# Patient Record
Sex: Female | Born: 1990 | Race: Black or African American | Hispanic: No | Marital: Married | Smoking: Former smoker
Health system: Southern US, Community
[De-identification: ages and names within clinical notes are randomized; demographics above are authoritative.]

## PROBLEM LIST (undated history)

## (undated) DIAGNOSIS — E119 Type 2 diabetes mellitus without complications: Secondary | ICD-10-CM

## (undated) DIAGNOSIS — R87629 Unspecified abnormal cytological findings in specimens from vagina: Secondary | ICD-10-CM

## (undated) DIAGNOSIS — J45909 Unspecified asthma, uncomplicated: Secondary | ICD-10-CM

## (undated) DIAGNOSIS — I10 Essential (primary) hypertension: Secondary | ICD-10-CM

## (undated) DIAGNOSIS — Z8719 Personal history of other diseases of the digestive system: Secondary | ICD-10-CM

## (undated) HISTORY — DX: Unspecified asthma, uncomplicated: J45.909

## (undated) HISTORY — DX: Unspecified abnormal cytological findings in specimens from vagina: R87.629

## (undated) HISTORY — DX: Essential (primary) hypertension: I10

## (undated) HISTORY — DX: Personal history of other diseases of the digestive system: Z87.19

## (undated) HISTORY — PX: CERVICAL POLYPECTOMY: SHX88

---

## 2010-08-11 ENCOUNTER — Emergency Department (HOSPITAL_COMMUNITY)
Admission: EM | Admit: 2010-08-11 | Discharge: 2010-08-11 | Disposition: A | Discharge status: Home / Self Care | Payer: BC Managed Care – PPO | Attending: Emergency Medicine | Admitting: Emergency Medicine

## 2010-08-11 ENCOUNTER — Emergency Department (HOSPITAL_COMMUNITY): Payer: BC Managed Care – PPO

## 2010-08-11 DIAGNOSIS — X500XXA Overexertion from strenuous movement or load, initial encounter: Secondary | ICD-10-CM | POA: Insufficient documentation

## 2010-08-11 DIAGNOSIS — S8000XA Contusion of unspecified knee, initial encounter: Secondary | ICD-10-CM | POA: Insufficient documentation

## 2010-08-11 DIAGNOSIS — M25569 Pain in unspecified knee: Secondary | ICD-10-CM | POA: Insufficient documentation

## 2010-08-11 DIAGNOSIS — E119 Type 2 diabetes mellitus without complications: Secondary | ICD-10-CM | POA: Insufficient documentation

## 2013-01-24 ENCOUNTER — Emergency Department (HOSPITAL_COMMUNITY)
Admission: EM | Admit: 2013-01-24 | Discharge: 2013-01-24 | Disposition: A | Discharge status: Home / Self Care | Payer: BC Managed Care – PPO | Attending: Emergency Medicine | Admitting: Emergency Medicine

## 2013-01-24 ENCOUNTER — Encounter (HOSPITAL_COMMUNITY): Payer: Self-pay | Admitting: Adult Health

## 2013-01-24 DIAGNOSIS — Y990 Civilian activity done for income or pay: Secondary | ICD-10-CM | POA: Insufficient documentation

## 2013-01-24 DIAGNOSIS — T63461A Toxic effect of venom of wasps, accidental (unintentional), initial encounter: Secondary | ICD-10-CM | POA: Insufficient documentation

## 2013-01-24 DIAGNOSIS — M549 Dorsalgia, unspecified: Secondary | ICD-10-CM | POA: Insufficient documentation

## 2013-01-24 DIAGNOSIS — T6391XA Toxic effect of contact with unspecified venomous animal, accidental (unintentional), initial encounter: Secondary | ICD-10-CM | POA: Insufficient documentation

## 2013-01-24 DIAGNOSIS — Y939 Activity, unspecified: Secondary | ICD-10-CM | POA: Insufficient documentation

## 2013-01-24 DIAGNOSIS — Y9229 Other specified public building as the place of occurrence of the external cause: Secondary | ICD-10-CM | POA: Insufficient documentation

## 2013-01-24 DIAGNOSIS — S21209A Unspecified open wound of unspecified back wall of thorax without penetration into thoracic cavity, initial encounter: Secondary | ICD-10-CM | POA: Insufficient documentation

## 2013-01-24 NOTE — ED Notes (Signed)
Pt was stung by a bee at 3 pm this afternoon, denies allergy, has never been stung before and was told to come to ER. Stung on left flank.

## 2013-01-24 NOTE — ED Provider Notes (Signed)
CSN: [id number]     Arrival date & time 01/24/13  1748 History  This chart was scribed for non-physician practitioner Junius Finner, PA-C working with Junius Argyle, MD by Leone Payor, ED Scribe. This patient was seen in room TR07C/TR07C and the patient's care was started at 1748.    Chief Complaint  Patient presents with  . Insect Bite    The history is provided by the patient. No language interpreter was used.    HPI Comments: Latasha [name] is a 22 y.o. female who presents to the Emergency Department complaining of a bee sting to the back that occurred 2 hours ago. Pt denies any allergies to bees and has never been stung before. 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 She denies trouble breathing, rash, itchy throat, trouble swallowing.   History reviewed. No pertinent past medical history. No past surgical history on file. No family history on file. History  Substance Use Topics  . Smoking status: Not on file  . Smokeless tobacco: Not on file  . Alcohol Use: Not on file   OB History   Grav Para Term Preterm Abortions TAB SAB Ect Mult Living                 Review of Systems  HENT: Negative for sore throat and trouble swallowing.   Respiratory: Negative for shortness of breath.   Skin: Negative for rash.       Bee sting to the back  All other systems reviewed and are negative.    Allergies  Review of patient's allergies indicates no known allergies.  Home Medications  No current outpatient prescriptions on file. BP 124/79  Pulse 91  Temp(Src) 98.4 F (36.9 C) (Oral)  Resp 16  SpO2 98% Physical Exam  Nursing note and vitals reviewed. Constitutional: She is oriented to person, place, and time. She appears well-developed and well-nourished.  Pt lying comfortably in exam bed, NAD.    HENT:  Head: Normocephalic and atraumatic.  Eyes: EOM are normal.  Neck: Normal range of motion.  Cardiovascular:  Normal rate, regular rhythm and normal heart sounds.   Pulmonary/Chest: Effort normal and breath sounds normal. No respiratory distress. She has no wheezes. She has no rales. She exhibits no tenderness.  No respiratory distress, able to speak in full sentences w/o difficulty.  Abdominal: Soft. Bowel sounds are normal. She exhibits no distension and no mass. There is no tenderness. There is no rebound and no guarding.  Musculoskeletal: Normal range of motion.  Neurological: She is alert and oriented to person, place, and time.  Skin: Skin is warm and dry.  Pin-point puncture mark on left middle back, mild erythema and TTP.  No induration or drainage. No foreign bodies seen at puncture site.  Psychiatric: She has a normal mood and affect. Her behavior is normal.    ED Course  Procedures   DIAGNOSTIC STUDIES: Oxygen Saturation is 100% on RA, normal by my interpretation.    COORDINATION OF CARE: 8:00 AM Discussed treatment plan with pt at bedside and pt agreed to plan.   Labs Review Labs Reviewed - No data to display Imaging Review No results found.  MDM   1. Bee sting, initial encounter    Pt presented to ED after bee sting per request of her employer.  Pt denies previous hx of bee stings or allergies. No c/o at this time. Pt appears well.  Small puncture  wound seen in left middle back with mild TTP.  No induration or drainage. No foreign bodies seen. Pt discharged home with return precautions. May use benadryl and ice as needed for pain and irritation. Pt verbalized understanding and agreement with tx plan.  I personally performed the services described in this documentation, which was scribed in my presence. The recorded information has been reviewed and is accurate.   Junius Finner, PA-C 01/25/13 [phone]

## 2013-01-25 NOTE — ED Provider Notes (Signed)
Medical screening examination/treatment/procedure(s) were performed by non-physician practitioner and as supervising physician I was immediately available for consultation/collaboration.   Junius Argyle, MD 01/25/13 1228

## 2013-02-07 ENCOUNTER — Encounter (HOSPITAL_COMMUNITY): Payer: Self-pay | Admitting: Emergency Medicine

## 2013-02-07 ENCOUNTER — Emergency Department (HOSPITAL_COMMUNITY): Payer: BC Managed Care – PPO

## 2013-02-07 ENCOUNTER — Emergency Department (HOSPITAL_COMMUNITY)
Admission: EM | Admit: 2013-02-07 | Discharge: 2013-02-07 | Disposition: A | Discharge status: Home / Self Care | Payer: BC Managed Care – PPO | Attending: Emergency Medicine | Admitting: Emergency Medicine

## 2013-02-07 DIAGNOSIS — M545 Low back pain, unspecified: Secondary | ICD-10-CM | POA: Insufficient documentation

## 2013-02-07 DIAGNOSIS — R109 Unspecified abdominal pain: Secondary | ICD-10-CM | POA: Insufficient documentation

## 2013-02-07 DIAGNOSIS — R51 Headache: Secondary | ICD-10-CM | POA: Insufficient documentation

## 2013-02-07 DIAGNOSIS — R350 Frequency of micturition: Secondary | ICD-10-CM | POA: Insufficient documentation

## 2013-02-07 DIAGNOSIS — Z3490 Encounter for supervision of normal pregnancy, unspecified, unspecified trimester: Secondary | ICD-10-CM

## 2013-02-07 DIAGNOSIS — N898 Other specified noninflammatory disorders of vagina: Secondary | ICD-10-CM | POA: Insufficient documentation

## 2013-02-07 DIAGNOSIS — O9989 Other specified diseases and conditions complicating pregnancy, childbirth and the puerperium: Secondary | ICD-10-CM | POA: Insufficient documentation

## 2013-02-07 DIAGNOSIS — O99810 Abnormal glucose complicating pregnancy: Secondary | ICD-10-CM | POA: Insufficient documentation

## 2013-02-07 DIAGNOSIS — O99330 Smoking (tobacco) complicating pregnancy, unspecified trimester: Secondary | ICD-10-CM | POA: Insufficient documentation

## 2013-02-07 DIAGNOSIS — R300 Dysuria: Secondary | ICD-10-CM | POA: Insufficient documentation

## 2013-02-07 HISTORY — DX: Type 2 diabetes mellitus without complications: E11.9

## 2013-02-07 LAB — HCG, QUANTITATIVE, PREGNANCY: hCG, Beta Chain, Quant, S: 565 m[IU]/mL — ABNORMAL HIGH (ref <5)

## 2013-02-07 LAB — URINALYSIS, ROUTINE W REFLEX MICROSCOPIC
Hgb urine dipstick: NEGATIVE
Ketones, ur: NEGATIVE mg/dL
Leukocytes, UA: NEGATIVE
Nitrite: NEGATIVE
Specific Gravity, Urine: 1.031 — ABNORMAL HIGH (ref 1.005–1.030)
Urobilinogen, UA: 0.2 mg/dL (ref 0.0–1.0)
pH: 6 (ref 5.0–8.0)
pH: 6 (ref 5.0–8.0)

## 2013-02-07 LAB — URINE MICROSCOPIC-ADD ON

## 2013-02-07 LAB — WET PREP, GENITAL: Clue Cells Wet Prep HPF POC: NONE SEEN

## 2013-02-07 MED ORDER — ACETAMINOPHEN 500 MG PO TABS
1000.0000 mg | ORAL_TABLET | Freq: Once | ORAL | Status: AC
  Administered 2013-02-07: 1000 mg via ORAL
  Filled 2013-02-07: qty 2

## 2013-02-07 NOTE — ED Notes (Signed)
Pt is here with headache that started on Friday.  Pt with right lower back pain and frequency with urination.  No fever

## 2013-02-07 NOTE — ED Provider Notes (Signed)
CSN: [id number]     Arrival date & time 02/07/13  1449 History   First MD Initiated Contact with Patient 02/07/13 1646     Chief Complaint  Patient presents with  . Headache   (Consider location/radiation/quality/duration/timing/severity/associated sxs/prior Treatment) Patient is a 22 y.o. female presenting with headaches and dysuria. The history is provided by the patient.  Headache Pain location:  Generalized Quality:  Dull Onset quality:  Gradual Timing:  Intermittent Progression:  Waxing and waning Chronicity:  New Similar to prior headaches: yes   Relieved by:  Acetaminophen and NSAIDs Worsened by:  Activity Associated symptoms: back pain   Associated symptoms: no abdominal pain, no cough, no fatigue, no fever, no myalgias, no nausea, no neck stiffness, no numbness, no photophobia, no sore throat, no swollen glands, no URI and no vomiting   Dysuria Quality: frequency] Pain severity:  No pain Onset quality:  Gradual Duration:  2 weeks Timing:  Constant Progression:  Worsening Chronicity:  New Recent urinary tract infections: no   Associated symptoms: flank pain   Associated symptoms: no abdominal pain, no fever, no nausea, no vaginal discharge and no vomiting     Past Medical History  Diagnosis Date  . Diabetes mellitus without complication    History reviewed. No pertinent past surgical history. History reviewed. No pertinent family history. History  Substance Use Topics  . Smoking status: Current Every Day Smoker  . Smokeless tobacco: Not on file  . Alcohol Use: Yes     Comment: occ   OB History   Grav Para Term Preterm Abortions TAB SAB Ect Mult Living                 Review of Systems  Constitutional: Negative for fever and fatigue.  HENT: Negative for sore throat.   Eyes: Negative for photophobia.  Respiratory: Negative for cough.   Gastrointestinal: Negative for nausea, vomiting and abdominal pain.  Genitourinary: Positive for dysuria, frequency  and flank pain. Negative for urgency, decreased urine volume, vaginal bleeding, vaginal discharge, difficulty urinating, menstrual problem and pelvic pain.  Musculoskeletal: Positive for back pain. Negative for myalgias and neck stiffness.  Neurological: Positive for headaches. Negative for numbness.  All other systems reviewed and are negative.    Allergies  Review of patient's allergies indicates no known allergies.  Home Medications   Current Outpatient Rx  Name  Route  Sig  Dispense  Refill  . acetaminophen (TYLENOL) 500 MG tablet   Oral   Take 1,000 mg by mouth every 6 (six) hours as needed for pain.          BP 122/66  Pulse 89  Temp(Src) 97.2 F (36.2 C) (Oral)  Resp 16  Wt 267 lb 6 oz (121.281 kg)  SpO2 100%  LMP 12/25/2012 Physical Exam  Nursing note and vitals reviewed. Constitutional: She is oriented to person, place, and time. She appears well-developed and well-nourished. No distress.  HENT:  Head: Normocephalic and atraumatic.  Mouth/Throat: Oropharynx is clear and moist. No oropharyngeal exudate.  Eyes: Conjunctivae and EOM are normal. Pupils are equal, round, and reactive to light.  Fundoscopic exam:      The right eye shows no papilledema.       The left eye shows no papilledema.  Neck: Normal range of motion. Neck supple. No Brudzinski's sign and no Kernig's sign noted.  Cardiovascular: Normal rate, regular rhythm and normal heart sounds.  Exam reveals no gallop and no friction rub.   No murmur heard. Pulmonary/Chest:  Effort normal and breath sounds normal. No respiratory distress. She has no wheezes. She has no rales. She exhibits no tenderness.  Abdominal: Soft. She exhibits no distension and no mass. There is tenderness (left flank, mild). There is no rebound and no guarding.  Genitourinary: Uterus normal. No labial fusion. There is no rash, tenderness, lesion or injury on the right labia. There is no rash, tenderness, lesion or injury on the left  labia. Cervix exhibits no motion tenderness, no discharge and no friability. Right adnexum displays no mass, no tenderness and no fullness. Left adnexum displays no mass, no tenderness and no fullness. No erythema, tenderness or bleeding around the vagina. No foreign body around the vagina. Vaginal discharge (scant, white) found.  Musculoskeletal: Normal range of motion. She exhibits no edema and no tenderness.  Lymphadenopathy:    She has no cervical adenopathy.  Neurological: She is alert and oriented to person, place, and time.  Skin: Skin is warm and dry. No rash noted. She is not diaphoretic.  Psychiatric: She has a normal mood and affect. Her behavior is normal. Judgment and thought content normal.    ED Course  Procedures (including critical care time) Labs Review Labs Reviewed  WET PREP, GENITAL - Abnormal; Notable for the following:    WBC, Wet Prep HPF POC MANY (*)    All other components within normal limits  URINALYSIS, ROUTINE W REFLEX MICROSCOPIC - Abnormal; Notable for the following:    APPearance CLOUDY (*)    Specific Gravity, Urine 1.031 (*)    Bilirubin Urine SMALL (*)    Ketones, ur 15 (*)    Protein, ur 30 (*)    Leukocytes, UA MODERATE (*)    All other components within normal limits  URINE MICROSCOPIC-ADD ON - Abnormal; Notable for the following:    Squamous Epithelial / LPF MANY (*)    Bacteria, UA MANY (*)    Casts GRANULAR CAST (*)    All other components within normal limits  PREGNANCY, URINE - Abnormal; Notable for the following:    Preg Test, Ur POSITIVE (*)    All other components within normal limits  HCG, QUANTITATIVE, PREGNANCY - Abnormal; Notable for the following:    hCG, Beta Chain, Quant, S 565 (*)    All other components within normal limits  URINE CULTURE  GC/CHLAMYDIA PROBE AMP  URINALYSIS, ROUTINE W REFLEX MICROSCOPIC   Imaging Review US Ob Comp Less 14 Wks  02/07/2013   CLINICAL DATA:  Six weeks 2 days. Headache. Maternal diabetes.   EXAM: OBSTETRIC <14 WK [place] AND TRANSVAGINAL OB US  TECHNIQUE: Both transabdominal and transvaginal ultrasound examinations were performed for complete evaluation of the gestation as well as the maternal uterus, adnexal regions, and pelvic cul-de-sac. Transvaginal technique was performed to assess early pregnancy.  COMPARISON:  None.  FINDINGS: Intrauterine gestational sac: Probable single early intrauterine gestational sac seen  Yolk sac:  Not visualized  Embryo:  Not visualized  MSD:  3  mm   5 w   0  d  Maternal uterus/adnexae: Both ovaries are normal in appearance. No adnexal mass or free fluid visualized.  IMPRESSION: Probable early intrauterine gestational sac, but no yolk sac, fetal pole, or cardiac activity yet visualized. Recommend follow-up quantitative B-HCG levels and follow-up US in 14 days to confirm and assess viability. This recommendation follows SRU consensus guidelines: Diagnostic Criteria for Nonviable Pregnancy Early in the First Trimester. Malva Limes Med 2013; 161:0960-45.   Electronically Signed   By:  Myles Rosenthal M.D.   On: 02/07/2013 20:31   US Ob Transvaginal  02/07/2013   CLINICAL DATA:  Six weeks 2 days. Headache. Maternal diabetes.  EXAM: OBSTETRIC <14 WK [place] AND TRANSVAGINAL OB US  TECHNIQUE: Both transabdominal and transvaginal ultrasound examinations were performed for complete evaluation of the gestation as well as the maternal uterus, adnexal regions, and pelvic cul-de-sac. Transvaginal technique was performed to assess early pregnancy.  COMPARISON:  None.  FINDINGS: Intrauterine gestational sac: Probable single early intrauterine gestational sac seen  Yolk sac:  Not visualized  Embryo:  Not visualized  MSD:  3  mm   5 w   0  d  Maternal uterus/adnexae: Both ovaries are normal in appearance. No adnexal mass or free fluid visualized.  IMPRESSION: Probable early intrauterine gestational sac, but no yolk sac, fetal pole, or cardiac activity yet visualized. Recommend follow-up  quantitative B-HCG levels and follow-up US in 14 days to confirm and assess viability. This recommendation follows SRU consensus guidelines: Diagnostic Criteria for Nonviable Pregnancy Early in the First Trimester. Malva Limes Med 2013; 161:0960-45.   Electronically Signed   By: Myles Rosenthal M.D.   On: 02/07/2013 20:31    EKG Interpretation   None       MDM   1. Pregnancy     22 year old female with no significant past medical history who presents with 2 weeks of increased urinary frequency, lower paraspinal back pain, mild headache. Patient denies fevers. She has been sexually active but denies dysuria, vaginal pain, vaginal discharge. Last menstrual period was August 30.  Urine obtained and shows also pregnancy test. Urine concerning for urine tract infection versus contamination. Will obtain transvaginal ultrasound to evaluate for intrauterine pregnancy. Will also add on beta hCG. Pelvic and wet prep, gc / chl.  Pelvic exam without CMT, discharge. Less concern for PID. Cath UA obtained. Repeat urinalysis shows no signs of infection. Wet prep without Trichomonas, yeast, BV. Pelvic exam without signs of PID. HCG 565 and transvaginal ultrasound shows likely intrauterine gestational sac without fetal pole or yolk sac. Likely early IUP. Patient will need follow up with OB for repeat ultrasound as well as Quant in 2 weeks. Asymptomatic without pain at discharge. She ambulated without difficulty. Will follow with OB.  Discussed with the patient return precautions and need for follow up with OB. Patient voiced understanding. Stable for d/c. This patient was discussed with my attending, Dr. Ethelda Chick.    Dorna Leitz, MD 02/08/13 0010

## 2013-02-07 NOTE — ED Provider Notes (Signed)
Place of gradual onset diffuse headache onset 3 days ago. Also complains of right flank pain. Last menstrual period August 30. No abdominal pain. Patient alert Glasgow Coma Score 15. Note distress.  Doug Sou, MD 02/07/13 2358

## 2013-02-07 NOTE — ED Notes (Signed)
MD at bedside. 

## 2013-02-08 LAB — URINE CULTURE

## 2013-02-08 NOTE — ED Provider Notes (Signed)
I have personally seen and examined the patient.  I have discussed the plan of care with the resident.  I have reviewed the documentation on PMH/FH/Soc. History.  I have reviewed the documentation of the resident and agree.  Doug Sou, MD 02/08/13 [phone]

## 2013-02-15 ENCOUNTER — Other Ambulatory Visit: Payer: Self-pay | Admitting: Obstetrics & Gynecology

## 2013-02-15 DIAGNOSIS — O3680X0 Pregnancy with inconclusive fetal viability, not applicable or unspecified: Secondary | ICD-10-CM

## 2013-02-16 ENCOUNTER — Other Ambulatory Visit: Payer: Self-pay | Admitting: Obstetrics & Gynecology

## 2013-02-16 ENCOUNTER — Ambulatory Visit (INDEPENDENT_AMBULATORY_CARE_PROVIDER_SITE_OTHER): Payer: BC Managed Care – PPO

## 2013-02-16 DIAGNOSIS — O3680X0 Pregnancy with inconclusive fetal viability, not applicable or unspecified: Secondary | ICD-10-CM

## 2013-02-16 DIAGNOSIS — O26849 Uterine size-date discrepancy, unspecified trimester: Secondary | ICD-10-CM

## 2013-02-16 NOTE — Progress Notes (Addendum)
U/S-intrauterine gestational sac noted with +YS noted within, although somewhat irregular shape of GS noted, cx long and closed, bilateral adnexa wnl, no fetal pole noted, reviewed with Cyril Mourning, NP she recommended a repeat u/s in 1 week to confirm viability

## 2013-02-21 ENCOUNTER — Other Ambulatory Visit: Payer: Self-pay | Admitting: Obstetrics & Gynecology

## 2013-02-21 DIAGNOSIS — O3680X0 Pregnancy with inconclusive fetal viability, not applicable or unspecified: Secondary | ICD-10-CM

## 2013-02-22 ENCOUNTER — Other Ambulatory Visit: Payer: Self-pay | Admitting: Obstetrics & Gynecology

## 2013-02-22 ENCOUNTER — Ambulatory Visit (INDEPENDENT_AMBULATORY_CARE_PROVIDER_SITE_OTHER): Payer: BC Managed Care – PPO

## 2013-02-22 ENCOUNTER — Ambulatory Visit (INDEPENDENT_AMBULATORY_CARE_PROVIDER_SITE_OTHER): Payer: BC Managed Care – PPO | Admitting: Women's Health

## 2013-02-22 ENCOUNTER — Encounter: Payer: Self-pay | Admitting: Women's Health

## 2013-02-22 VITALS — BP 116/60 | Ht 66.0 in | Wt 274.0 lb

## 2013-02-22 DIAGNOSIS — Z1389 Encounter for screening for other disorder: Secondary | ICD-10-CM

## 2013-02-22 DIAGNOSIS — O3680X0 Pregnancy with inconclusive fetal viability, not applicable or unspecified: Secondary | ICD-10-CM

## 2013-02-22 DIAGNOSIS — Z283 Underimmunization status: Secondary | ICD-10-CM

## 2013-02-22 DIAGNOSIS — Z23 Encounter for immunization: Secondary | ICD-10-CM

## 2013-02-22 DIAGNOSIS — Z3401 Encounter for supervision of normal first pregnancy, first trimester: Secondary | ICD-10-CM

## 2013-02-22 DIAGNOSIS — E669 Obesity, unspecified: Secondary | ICD-10-CM

## 2013-02-22 DIAGNOSIS — O0990 Supervision of high risk pregnancy, unspecified, unspecified trimester: Secondary | ICD-10-CM | POA: Insufficient documentation

## 2013-02-22 DIAGNOSIS — F1290 Cannabis use, unspecified, uncomplicated: Secondary | ICD-10-CM

## 2013-02-22 DIAGNOSIS — O26849 Uterine size-date discrepancy, unspecified trimester: Secondary | ICD-10-CM

## 2013-02-22 DIAGNOSIS — O24919 Unspecified diabetes mellitus in pregnancy, unspecified trimester: Secondary | ICD-10-CM

## 2013-02-22 DIAGNOSIS — O09899 Supervision of other high risk pregnancies, unspecified trimester: Secondary | ICD-10-CM

## 2013-02-22 DIAGNOSIS — O0991 Supervision of high risk pregnancy, unspecified, first trimester: Secondary | ICD-10-CM

## 2013-02-22 DIAGNOSIS — O24311 Unspecified pre-existing diabetes mellitus in pregnancy, first trimester: Secondary | ICD-10-CM

## 2013-02-22 DIAGNOSIS — E119 Type 2 diabetes mellitus without complications: Secondary | ICD-10-CM

## 2013-02-22 DIAGNOSIS — F1920 Other psychoactive substance dependence, uncomplicated: Secondary | ICD-10-CM

## 2013-02-22 DIAGNOSIS — O24911 Unspecified diabetes mellitus in pregnancy, first trimester: Secondary | ICD-10-CM

## 2013-02-22 DIAGNOSIS — O99019 Anemia complicating pregnancy, unspecified trimester: Secondary | ICD-10-CM

## 2013-02-22 DIAGNOSIS — Z331 Pregnant state, incidental: Secondary | ICD-10-CM

## 2013-02-22 LAB — POCT URINALYSIS DIPSTICK
Ketones, UA: NEGATIVE
Leukocytes, UA: NEGATIVE

## 2013-02-22 MED ORDER — INFLUENZA VAC SPLIT QUAD 0.5 ML IM SUSP
0.5000 mL | Freq: Once | INTRAMUSCULAR | Status: AC
  Administered 2013-02-22: 0.5 mL via INTRAMUSCULAR

## 2013-02-22 NOTE — Progress Notes (Signed)
  Subjective:    Latasha [name] is a 22 y.o. G1P0 African American female at [redacted]w[redacted]d by today's u/s, being seen today for her first obstetrical visit.  Her obstetrical history is significant for obesity and diet-controlled type II DM.  States she checks her fasting cbg's once/wk, hasn't checked it this week and can't remember what it has been running. PCP was [facility], but she hasn't been in >62yr and was trying to locate another PCP.  She was smoking ~4 cigarettes/day and quit w/ +PT.  Per pt, had neg pap in Nov 2013 at [facility], she did have 2 cervical polypectomies.    Patient reports no complaints. Denies n/v, uti s/s, vb, cramping.   Filed Vitals:   02/22/13 1534  BP: 116/60  Weight: 274 lb (124.286 kg)    HISTORY: OB History  Gravida Para Term Preterm AB SAB TAB Ectopic Multiple Living  1             # Outcome Date GA Lbr Len/2nd Weight Sex Delivery Anes PTL Lv  1 CUR              Past Medical History  Diagnosis Date  . Diabetes mellitus without complication    Past Surgical History  Procedure Laterality Date  . Cervical polypectomy     Family History  Problem Relation Age of Onset  . Cancer Mother     breast, cervical  . Stroke Mother   . Kidney disease Father   . Stroke Maternal Aunt   . Hypertension Paternal Aunt   . Diabetes Paternal Uncle   . Hypertension Paternal Uncle   . Kidney disease Paternal Uncle   . Hypertension Maternal Grandmother   . Cancer Maternal Grandfather   . Diabetes Paternal Grandmother   . Diabetes Paternal Grandfather   . Hypertension Paternal Uncle      Exam   System:     Skin: normal coloration and turgor, no rashes    Neurologic: oriented, normal mood   Extremities: normal strength, tone, and muscle mass   HEENT PERRLA   Mouth/Teeth mucous membranes moist   Cardiovascular: regular rate and rhythm   Respiratory:  appears well, vitals normal, no respiratory distress, acyanotic, normal RR   Abdomen:  soft, non-tender    Thin prep pap smear not obtained, will get results from last pap   Assessment:    Pregnancy: G1P0 Patient Active Problem List   Diagnosis Date Noted  . Supervision of normal first pregnancy 02/22/2013    Priority: High      [redacted]w[redacted]d G1P0 New OB visit Obesity, pre-gravid BMI 42.4 Diet-controlled Type II DM Recent smoker, now quit Flu shot today    Plan:     Initial labs drawn, including A1C Continue prenatal vitamins Problem list reviewed and updated Reviewed n/v relief measures and warning s/s to report Reviewed recommended weight gain based on pre-gravid BMI Encouraged well-balanced diet Genetic Screening discussed Integrated Screen: requested Cystic fibrosis screening discussed requested Ultrasound discussed; fetal survey: requested Follow up in 3 weeks for visit [facility] completed [facility] referral discussed, accepted, and completed To begin checking cbg's qid and bring log to each visit Discussed importance of tight glycemic control during pregnancy and potential complications associated w/ DM  Marge Duncans 02/22/2013 4:07 PM

## 2013-02-22 NOTE — Patient Instructions (Signed)
[facility] [phone]          [facility] [phone]               [facility] [phone]               [facility] [phone] 3rd Ileene Patrick) [phone]  Pregnancy - First Trimester During sexual intercourse, millions of sperm go into the vagina. Only 1 sperm will penetrate and fertilize the female egg while it is in the Fallopian tube. One week later, the fertilized egg implants into the wall of the uterus. An embryo begins to develop into a baby. At 6 to 8 weeks, the eyes and face are formed and the heartbeat can be seen on ultrasound. At the end of 12 weeks (first trimester), all the baby's organs are formed. Now that you are pregnant, you will want to do everything you can to have a healthy baby. Two of the most important things are to get good prenatal care and follow your caregiver's instructions. Prenatal care is all the medical care you receive before the baby's birth. It is given to prevent, find, and treat problems during the pregnancy and childbirth. PRENATAL EXAMS  During prenatal visits, your weight, blood pressure, and urine are checked. This is done to make sure you are healthy and progressing normally during the pregnancy.  A pregnant woman should gain 25 to 35 pounds during the pregnancy. However, if you are overweight or underweight, your caregiver will advise you regarding your weight.  Your caregiver will ask and answer questions for you.  Blood work, cervical cultures, other necessary tests, and a Pap test are done during your prenatal exams. These tests are done to check on your health and the probable health of your baby. Tests are strongly recommended and done for HIV with your permission. This is the virus that causes AIDS. These tests are done because medicines can be given to help prevent your baby from being born with this infection should you have been infected without knowing it. Blood work  is also used to find out your blood type, previous infections, and follow your blood levels (hemoglobin).  Low hemoglobin (anemia) is common during pregnancy. Iron and vitamins are given to help prevent this. Later in the pregnancy, blood tests for diabetes will be done along with any other tests if any problems develop.  You may need other tests to make sure you and the baby are doing well. CHANGES DURING THE FIRST TRIMESTER  Your body goes through many changes during pregnancy. They vary from person to person. Talk to your caregiver about changes you notice and are concerned about. Changes can include:  Your menstrual period stops.  The egg and sperm carry the genes that determine what you look like. Genes from you and your partner are forming a baby. The female genes determine whether the baby is a boy or a girl.  Your body increases in girth and you may feel bloated.  Feeling sick to your stomach (nauseous) and throwing up (vomiting). If the vomiting is uncontrollable, call your caregiver.  Your breasts will begin to enlarge and become tender.  Your nipples may stick out more and become darker.  The need to urinate more. Painful urination may mean you have a bladder infection.  Tiring easily.  Loss of appetite.  Cravings for certain kinds of food.  At first, you may gain or lose a couple of pounds.  You may  have changes in your emotions from day to day (excited to be pregnant or concerned something may go wrong with the pregnancy and baby).  You may have more vivid and strange dreams. HOME CARE INSTRUCTIONS   It is very important to avoid all smoking, alcohol and non-prescribed drugs during your pregnancy. These affect the formation and growth of the baby. Avoid chemicals while pregnant to ensure the delivery of a healthy infant.  Start your prenatal visits by the 12th week of pregnancy. They are usually scheduled monthly at first, then more often in the last 2 months before  delivery. Keep your caregiver's appointments. Follow your caregiver's instructions regarding medicine use, blood and lab tests, exercise, and diet.  During pregnancy, you are providing food for you and your baby. Eat regular, well-balanced meals. Choose foods such as meat, fish, milk and other low fat dairy products, vegetables, fruits, and whole-grain breads and cereals. Your caregiver will tell you of the ideal weight gain.  You can help morning sickness by keeping soda crackers at the bedside. Eat a couple before arising in the morning. You may want to use the crackers without salt on them.  Eating 4 to 5 small meals rather than 3 large meals a day also may help the nausea and vomiting.  Drinking liquids between meals instead of during meals also seems to help nausea and vomiting.  A physical sexual relationship may be continued throughout pregnancy if there are no other problems. Problems may be early (premature) leaking of amniotic fluid from the membranes, vaginal bleeding, or belly (abdominal) pain.  Exercise regularly if there are no restrictions. Check with your caregiver or physical therapist if you are unsure of the safety of some of your exercises. Greater weight gain will occur in the last 2 trimesters of pregnancy. Exercising will help:  Control your weight.  Keep you in shape.  Prepare you for labor and delivery.  Help you lose your pregnancy weight after you deliver your baby.  Wear a good support or jogging bra for breast tenderness during pregnancy. This may help if worn during sleep too.  Ask when prenatal classes are available. Begin classes when they are offered.  Do not use hot tubs, steam rooms, or saunas.  Wear your seat belt when driving. This protects you and your baby if you are in an accident.  Avoid raw meat, uncooked cheese, cat litter boxes, and soil used by cats throughout the pregnancy. These carry germs that can cause birth defects in the baby.  The  first trimester is a good time to visit your dentist for your dental health. Getting your teeth cleaned is okay. Use a softer toothbrush and brush gently during pregnancy.  Ask for help if you have financial, counseling, or nutritional needs during pregnancy. Your caregiver will be able to offer counseling for these needs as well as refer you for other special needs.  Do not take any medicines or herbs unless told by your caregiver.  Inform your caregiver if there is any mental or physical domestic violence.  Make a list of emergency phone numbers of family, friends, hospital, and police and fire departments.  Write down your questions. Take them to your prenatal visit.  Do not douche.  Do not cross your legs.  If you have to stand for long periods of time, rotate you feet or take small steps in a circle.  You may have more vaginal secretions that may require a sanitary pad. Do not use tampons or  scented sanitary pads. MEDICINES AND DRUG USE IN PREGNANCY  Take prenatal vitamins as directed. The vitamin should contain 1 milligram of folic acid. Keep all vitamins out of reach of children. Only a couple vitamins or tablets containing iron may be fatal to a baby or young child when ingested.  Avoid use of all medicines, including herbs, over-the-counter medicines, not prescribed or suggested by your caregiver. Only take over-the-counter or prescription medicines for pain, discomfort, or fever as directed by your caregiver. Do not use aspirin, ibuprofen, or naproxen unless directed by your caregiver.  Let your caregiver also know about herbs you may be using.  Alcohol is related to a number of birth defects. This includes fetal alcohol syndrome. All alcohol, in any form, should be avoided completely. Smoking will cause low birth rate and premature babies.  Street or illegal drugs are very harmful to the baby. They are absolutely forbidden. A baby born to an addicted mother will be addicted at  birth. The baby will go through the same withdrawal an adult does.  Let your caregiver know about any medicines that you have to take and for what reason you take them. SEEK MEDICAL CARE IF:  You have any concerns or worries during your pregnancy. It is better to call with your questions if you feel they cannot wait, rather than worry about them. SEEK IMMEDIATE MEDICAL CARE IF:   An unexplained oral temperature above 102 F (38.9 C) develops, or as your caregiver suggests.  You have leaking of fluid from the vagina (birth canal). If leaking membranes are suspected, take your temperature and inform your caregiver of this when you call.  There is vaginal spotting or bleeding. Notify your caregiver of the amount and how many pads are used.  You develop a bad smelling vaginal discharge with a change in the color.  You continue to feel sick to your stomach (nauseated) and have no relief from remedies suggested. You vomit blood or coffee ground-like materials.  You lose more than 2 pounds of weight in 1 week.  You gain more than 2 pounds of weight in 1 week and you notice swelling of your face, hands, feet, or legs.  You gain 5 pounds or more in 1 week (even if you do not have swelling of your hands, face, legs, or feet).  You get exposed to [place] measles and have never had them.  You are exposed to fifth disease or chickenpox.  You develop belly (abdominal) pain. Round ligament discomfort is a common non-cancerous (benign) cause of abdominal pain in pregnancy. Your caregiver still must evaluate this.  You develop headache, fever, diarrhea, pain with urination, or shortness of breath.  You fall or are in a car accident or have any kind of trauma.  There is mental or physical violence in your home. Document Released: 04/08/2001 Document Revised: 01/07/2012 Document Reviewed: 10/10/2008 [facility] Patient Information 2014 [place], [place].

## 2013-02-22 NOTE — Progress Notes (Signed)
U/S-transvaginal u/s performed, single embryo with yolk sac noted, CRL c/w 6+0wks, EDD 10/17/2013, cx long and closed, bilateral adnexa wnl, FHR-110bpm

## 2013-02-23 DIAGNOSIS — Z283 Underimmunization status: Secondary | ICD-10-CM | POA: Insufficient documentation

## 2013-02-23 DIAGNOSIS — F1290 Cannabis use, unspecified, uncomplicated: Secondary | ICD-10-CM | POA: Insufficient documentation

## 2013-02-23 DIAGNOSIS — O09899 Supervision of other high risk pregnancies, unspecified trimester: Secondary | ICD-10-CM | POA: Insufficient documentation

## 2013-02-23 DIAGNOSIS — O24319 Unspecified pre-existing diabetes mellitus in pregnancy, unspecified trimester: Secondary | ICD-10-CM | POA: Insufficient documentation

## 2013-02-23 LAB — URINE CULTURE
Colony Count: NO GROWTH
Organism ID, Bacteria: NO GROWTH

## 2013-02-23 LAB — DRUG SCREEN, URINE, NO CONFIRMATION
Amphetamine Screen, Ur: NEGATIVE
Benzodiazepines.: NEGATIVE
Cocaine Metabolites: NEGATIVE
Creatinine,U: 385.9 mg/dL
Marijuana Metabolite: POSITIVE — AB
Phencyclidine (PCP): NEGATIVE
Propoxyphene: NEGATIVE

## 2013-02-23 LAB — GC/CHLAMYDIA PROBE AMP
CT Probe RNA: NEGATIVE
GC Probe RNA: NEGATIVE

## 2013-02-23 LAB — URINALYSIS
Leukocytes, UA: NEGATIVE
Nitrite: NEGATIVE
Specific Gravity, Urine: 1.021 (ref 1.005–1.030)
Urobilinogen, UA: 0.2 mg/dL (ref 0.0–1.0)
pH: 6 (ref 5.0–8.0)

## 2013-02-23 LAB — CBC
HCT: 34.8 % — ABNORMAL LOW (ref 36.0–46.0)
Hemoglobin: 11.2 g/dL — ABNORMAL LOW (ref 12.0–15.0)
WBC: 11.7 10*3/uL — ABNORMAL HIGH (ref 4.0–10.5)

## 2013-02-23 LAB — RPR

## 2013-02-23 LAB — VARICELLA ZOSTER ANTIBODY, IGG: Varicella IgG: 480 Index — ABNORMAL HIGH (ref <135.00)

## 2013-02-23 LAB — SICKLE CELL SCREEN: Sickle Cell Screen: NEGATIVE

## 2013-02-23 LAB — HEPATITIS B SURFACE ANTIGEN: Hepatitis B Surface Ag: NEGATIVE

## 2013-02-23 LAB — OXYCODONE SCREEN, UA, RFLX CONFIRM: Oxycodone Screen, Ur: NEGATIVE ng/mL

## 2013-02-24 LAB — CYSTIC FIBROSIS DIAGNOSTIC STUDY

## 2013-02-26 ENCOUNTER — Encounter: Payer: Self-pay | Admitting: Women's Health

## 2013-03-01 ENCOUNTER — Encounter: Payer: BC Managed Care – PPO | Admitting: Advanced Practice Midwife

## 2013-03-04 ENCOUNTER — Telehealth: Payer: Self-pay | Admitting: Women's Health

## 2013-03-04 DIAGNOSIS — J45909 Unspecified asthma, uncomplicated: Secondary | ICD-10-CM

## 2013-03-07 ENCOUNTER — Encounter: Payer: Self-pay | Admitting: Women's Health

## 2013-03-07 DIAGNOSIS — J45909 Unspecified asthma, uncomplicated: Secondary | ICD-10-CM | POA: Insufficient documentation

## 2013-03-07 MED ORDER — ALBUTEROL SULFATE HFA 108 (90 BASE) MCG/ACT IN AERS: 2.0000 | INHALATION_SPRAY | Freq: Four times a day (QID) | RESPIRATORY_TRACT | Status: DC | PRN

## 2013-03-07 NOTE — Addendum Note (Signed)
Addended by: Shawna Clamp R on: 03/07/2013 01:44 PM   Modules accepted: Orders

## 2013-03-07 NOTE — Telephone Encounter (Signed)
Received call from Wilmer Floor, RN w/ [facility], who is concerned b/c pt states she is out of glucometer strips, but hadn't notified anyone. She is supposed to be checking cbg's QID and recording in log, but when RN asked pt, she stated 'they've been normal' but was unable to report actual readings.  Also w/ h/o asthma, w/ an attack 2wks ago, and doesn't have an inhaler. Pt was supposed to f/u 1wk after initial OB appt, so we could see what her cbg's were running, but she cancelled appt. She has an appt on 11/12 w/ [facility]. RN states she is going to find out what glucometer pt has and I will rx strips/lancets. Rx albuterol inhaler. Diabetic education referral has been placed.   Cheral Marker, CNM, WHNP-BC 03/07/2013 1:48 PM

## 2013-03-09 ENCOUNTER — Ambulatory Visit (INDEPENDENT_AMBULATORY_CARE_PROVIDER_SITE_OTHER): Payer: BC Managed Care – PPO

## 2013-03-09 ENCOUNTER — Ambulatory Visit (INDEPENDENT_AMBULATORY_CARE_PROVIDER_SITE_OTHER): Payer: BC Managed Care – PPO | Admitting: Advanced Practice Midwife

## 2013-03-09 ENCOUNTER — Other Ambulatory Visit: Payer: Self-pay | Admitting: Advanced Practice Midwife

## 2013-03-09 ENCOUNTER — Other Ambulatory Visit (HOSPITAL_COMMUNITY)
Admission: RE | Admit: 2013-03-09 | Discharge: 2013-03-09 | Disposition: A | Discharge status: Home / Self Care | Payer: BC Managed Care – PPO | Source: Ambulatory Visit | Attending: Advanced Practice Midwife | Admitting: Advanced Practice Midwife

## 2013-03-09 ENCOUNTER — Encounter: Payer: Self-pay | Admitting: Advanced Practice Midwife

## 2013-03-09 VITALS — BP 120/62 | Wt 273.5 lb

## 2013-03-09 DIAGNOSIS — O021 Missed abortion: Secondary | ICD-10-CM

## 2013-03-09 DIAGNOSIS — Z1151 Encounter for screening for human papillomavirus (HPV): Secondary | ICD-10-CM | POA: Insufficient documentation

## 2013-03-09 DIAGNOSIS — Z01419 Encounter for gynecological examination (general) (routine) without abnormal findings: Secondary | ICD-10-CM | POA: Insufficient documentation

## 2013-03-09 DIAGNOSIS — Z1389 Encounter for screening for other disorder: Secondary | ICD-10-CM

## 2013-03-09 DIAGNOSIS — O0991 Supervision of high risk pregnancy, unspecified, first trimester: Secondary | ICD-10-CM

## 2013-03-09 DIAGNOSIS — Z331 Pregnant state, incidental: Secondary | ICD-10-CM

## 2013-03-09 DIAGNOSIS — R87810 Cervical high risk human papillomavirus (HPV) DNA test positive: Secondary | ICD-10-CM | POA: Insufficient documentation

## 2013-03-09 DIAGNOSIS — Z3401 Encounter for supervision of normal first pregnancy, first trimester: Secondary | ICD-10-CM

## 2013-03-09 LAB — POCT URINALYSIS DIPSTICK
Ketones, UA: NEGATIVE
Leukocytes, UA: NEGATIVE

## 2013-03-09 MED ORDER — ACCU-CHEK FASTCLIX LANCETS MISC: 1.0000 [IU] | Freq: Four times a day (QID) | Status: DC

## 2013-03-09 MED ORDER — GLUCOSE BLOOD VI STRP: ORAL_STRIP | Status: DC

## 2013-03-09 NOTE — Progress Notes (Signed)
U/S-transvaginal u/s performed, single IUP with NO FCA noted, CRL c/w [redacted]w[redacted]d, cx appears long and closed, bilateral adnexa WNL, Drenda Freeze present during u/s

## 2013-03-09 NOTE — Progress Notes (Signed)
Pap smear collected No FCA on [place].  No cramping or bleeding. Pt counseled:  Wait and watch, cytotec induction, surgical D&C.  Cytotec was recommended as preferred option.  Pt and her parents will go home and discuss it and let us know.  Grieving appropriately.  Pt is 0+.

## 2013-03-09 NOTE — Patient Instructions (Signed)
Fasting ( before you eat or drink):  Less than 90 2 hours after each meal:  Less than 120

## 2013-03-14 ENCOUNTER — Telehealth: Payer: Self-pay | Admitting: *Deleted

## 2013-03-15 ENCOUNTER — Encounter: Payer: BC Managed Care – PPO | Admitting: Advanced Practice Midwife

## 2013-03-15 MED ORDER — NORGESTIMATE-ETH ESTRADIOL 0.25-35 MG-MCG PO TABS: 1.0000 | ORAL_TABLET | Freq: Every day | ORAL | Status: DC

## 2013-03-15 MED ORDER — MISOPROSTOL 200 MCG PO TABS: ORAL_TABLET | ORAL | Status: DC

## 2013-03-15 MED ORDER — IBUPROFEN 800 MG PO TABS: 800.0000 mg | ORAL_TABLET | Freq: Four times a day (QID) | ORAL | Status: DC | PRN

## 2013-03-15 NOTE — Telephone Encounter (Signed)
Pt has several questions concerning cytotec induction verses D & C. Requesting Rodena Piety, CNM to call pt and discuss.

## 2013-03-15 NOTE — Telephone Encounter (Signed)
TC from pt.  No cramping or bleeding.  Has decided to do cytotec.  Cytotec 400mg  PV/ibuprofen/sprinetec sent to [facility] in [place].  Warning signs (bleeding >2 pads/hour) discussed.  Pt sounds well.

## 2013-03-17 ENCOUNTER — Encounter: Payer: Self-pay | Admitting: Advanced Practice Midwife

## 2013-04-12 ENCOUNTER — Ambulatory Visit (INDEPENDENT_AMBULATORY_CARE_PROVIDER_SITE_OTHER): Payer: BC Managed Care – PPO | Admitting: Obstetrics & Gynecology

## 2013-04-12 ENCOUNTER — Encounter: Payer: Self-pay | Admitting: Obstetrics & Gynecology

## 2013-04-12 VITALS — BP 110/70 | Ht 66.0 in | Wt 274.0 lb

## 2013-04-12 DIAGNOSIS — N879 Dysplasia of cervix uteri, unspecified: Secondary | ICD-10-CM | POA: Insufficient documentation

## 2013-04-12 DIAGNOSIS — Z8639 Personal history of other endocrine, nutritional and metabolic disease: Secondary | ICD-10-CM | POA: Insufficient documentation

## 2013-04-12 DIAGNOSIS — Z3202 Encounter for pregnancy test, result negative: Secondary | ICD-10-CM

## 2013-04-12 DIAGNOSIS — E119 Type 2 diabetes mellitus without complications: Secondary | ICD-10-CM

## 2013-04-12 DIAGNOSIS — R87810 Cervical high risk human papillomavirus (HPV) DNA test positive: Secondary | ICD-10-CM

## 2013-04-12 DIAGNOSIS — R87610 Atypical squamous cells of undetermined significance on cytologic smear of cervix (ASC-US): Secondary | ICD-10-CM

## 2013-04-12 NOTE — Progress Notes (Signed)
Patient ID: Latasha [name], female   DOB: 01/06/1991, 22 y.o.   MRN: [id number] Pt had first trimester pregnancy loss, spontaneous Pap performed reveals ASCUS +HPV HR  Colposcopy: Mild Koilocytic changes, no dense AWE No punctation no mosaicism no abnormal vascular changes  Follow up evaluation in 6 months Strongly encouraged to stop smoking    Patient Active Problem List   Diagnosis Date Noted  . Dysplasia of cervix, unspecified 04/12/2013  . Diabetes 04/12/2013  . Asthma 03/07/2013  . Obesity, morbid, BMI 40.0-49.9 02/23/2013  . Rubella non-immune status, antepartum 02/23/2013  . Marijuana use 02/23/2013    Past Medical History  Diagnosis Date  . Diabetes mellitus without complication     Past Surgical History  Procedure Laterality Date  . Cervical polypectomy      OB History   Grav Para Term Preterm Abortions TAB SAB Ect Mult Living   1               No Known Allergies  History   Social History  . Marital Status: Single    Spouse Name: N/A    Number of Children: N/A  . Years of Education: N/A   Social History Main Topics  . Smoking status: Current Some Day Smoker    Types: Cigarettes  . Smokeless tobacco: Never Used  . Alcohol Use: Yes     Comment: occ; not now  . Drug Use: Yes    Special: Marijuana     Comment: not now  . Sexual Activity: Yes    Birth Control/ Protection: None   Other Topics Concern  . None   Social History Narrative  . None    Family History  Problem Relation Age of Onset  . Cancer Mother     breast, cervical  . Stroke Mother   . Hypertension Mother   . Kidney disease Father   . Stroke Maternal Aunt   . Hypertension Paternal Aunt   . Diabetes Paternal Uncle   . Hypertension Paternal Uncle   . Kidney disease Paternal Uncle   . Hypertension Maternal Grandmother   . Cancer Maternal Grandfather   . Diabetes Paternal Grandmother   . Diabetes Paternal Grandfather   . Hypertension Paternal Uncle

## 2013-09-05 ENCOUNTER — Other Ambulatory Visit: Payer: Self-pay | Admitting: *Deleted

## 2013-09-06 MED ORDER — GLUCOSE BLOOD VI STRP: ORAL_STRIP | Status: DC

## 2014-02-27 ENCOUNTER — Encounter: Payer: Self-pay | Admitting: Obstetrics & Gynecology

## 2014-03-10 ENCOUNTER — Other Ambulatory Visit (HOSPITAL_COMMUNITY)
Admission: RE | Admit: 2014-03-10 | Discharge: 2014-03-10 | Disposition: A | Discharge status: Home / Self Care | Payer: BC Managed Care – PPO | Source: Ambulatory Visit | Attending: Obstetrics & Gynecology | Admitting: Obstetrics & Gynecology

## 2014-03-10 ENCOUNTER — Encounter: Payer: Self-pay | Admitting: Obstetrics & Gynecology

## 2014-03-10 ENCOUNTER — Ambulatory Visit (INDEPENDENT_AMBULATORY_CARE_PROVIDER_SITE_OTHER): Payer: BC Managed Care – PPO | Admitting: Obstetrics & Gynecology

## 2014-03-10 VITALS — BP 110/70 | Ht 66.0 in | Wt 270.0 lb

## 2014-03-10 DIAGNOSIS — Z01419 Encounter for gynecological examination (general) (routine) without abnormal findings: Secondary | ICD-10-CM | POA: Insufficient documentation

## 2014-03-10 DIAGNOSIS — J4520 Mild intermittent asthma, uncomplicated: Secondary | ICD-10-CM

## 2014-03-10 MED ORDER — ALBUTEROL SULFATE HFA 108 (90 BASE) MCG/ACT IN AERS: 2.0000 | INHALATION_SPRAY | Freq: Four times a day (QID) | RESPIRATORY_TRACT | Status: AC | PRN

## 2014-03-10 NOTE — Progress Notes (Signed)
Patient ID: Latasha [name], female   DOB: [birth year], 23 y.o.   MRN: [id number] Subjective:     Latasha [name] is a 23 y.o. female here for a routine exam.  Patient's last menstrual period was 02/27/2014. G1P0 Birth Control Method:  None wants to get pregnant Menstrual Calendar(currently): regular  Current complaints: some greyish discharge.   Current acute medical issues:  none   Recent Gynecologic History Patient's last menstrual period was 02/27/2014. Last Pap: 2014,  ASCUS +HPV Last mammogram: ,    Past Medical History  Diagnosis Date  . Diabetes mellitus without complication   . Asthma     Past Surgical History  Procedure Laterality Date  . Cervical polypectomy      OB History    Gravida Para Term Preterm AB TAB SAB Ectopic Multiple Living   1               History   Social History  . Marital Status: Single    Spouse Name: N/A    Number of Children: N/A  . Years of Education: N/A   Social History Main Topics  . Smoking status: Current Some Day Smoker    Types: Cigarettes  . Smokeless tobacco: Never Used  . Alcohol Use: Yes     Comment: occ; not now  . Drug Use: Yes    Special: Marijuana     Comment: not now  . Sexual Activity: Yes    Birth Control/ Protection: None   Other Topics Concern  . None   Social History Narrative    Family History  Problem Relation Age of Onset  . Cancer Mother     breast, cervical  . Stroke Mother   . Hypertension Mother   . Kidney disease Father   . Stroke Maternal Aunt   . Hypertension Paternal Aunt   . Diabetes Paternal Uncle   . Hypertension Paternal Uncle   . Kidney disease Paternal Uncle   . Hypertension Maternal Grandmother   . Cancer Maternal Grandfather   . Diabetes Paternal Grandmother   . Diabetes Paternal Grandfather   . Hypertension Paternal Uncle     Current outpatient prescriptions: albuterol (PROVENTIL HFA;VENTOLIN HFA) 108 (90 BASE) MCG/ACT inhaler, Inhale 2 puffs into the lungs every 6 (six)  hours as needed for wheezing or shortness of breath., Disp: 1 Inhaler, Rfl: 2;  Prenatal Vit-Fe Sulfate-FA (PRENATAL VITAMIN PO), Take by mouth daily., Disp: , Rfl: ;  ACCU-CHEK FASTCLIX LANCETS MISC, 1 Units by Percutaneous route 4 (four) times daily., Disp: 100 each, Rfl: 12 acetaminophen (TYLENOL) 500 MG tablet, Take 1,000 mg by mouth every 6 (six) hours as needed for pain., Disp: , Rfl: ;  glucose blood test strip, Use as instructed, Disp: 100 each, Rfl: 12;  ibuprofen (ADVIL,MOTRIN) 800 MG tablet, Take 1 tablet (800 mg total) by mouth every 6 (six) hours as needed., Disp: 30 tablet, Rfl: 1 misoprostol (CYTOTEC) 200 MCG tablet, Place 2 tablets in the back of your vagina.  Do this when you can lie down for several hours.  If not response in 48 hours ,repeat dose, Disp: 2 tablet, Rfl: 1;  norgestimate-ethinyl estradiol (ORTHO-CYCLEN,SPRINTEC,PREVIFEM) 0.25-35 MG-MCG tablet, Take 1 tablet by mouth daily., Disp: 1 Package, Rfl: 11  Review of Systems  Review of Systems  Constitutional: Negative for fever, chills, weight loss, malaise/fatigue and diaphoresis.  HENT: Negative for hearing loss, ear pain, nosebleeds, congestion, sore throat, neck pain, tinnitus and ear discharge.   Eyes: Negative for blurred vision, double vision,  photophobia, pain, discharge and redness.  Respiratory: Negative for cough, hemoptysis, sputum production, shortness of breath, wheezing and stridor.   Cardiovascular: Negative for chest pain, palpitations, orthopnea, claudication, leg swelling and PND.  Gastrointestinal: negative for abdominal pain. Negative for heartburn, nausea, vomiting, diarrhea, constipation, blood in stool and melena.  Genitourinary: Negative for dysuria, urgency, frequency, hematuria and flank pain.  Musculoskeletal: Negative for myalgias, back pain, joint pain and falls.  Skin: Negative for itching and rash.  Neurological: Negative for dizziness, tingling, tremors, sensory change, speech change, focal  weakness, seizures, loss of consciousness, weakness and headaches.  Endo/Heme/Allergies: Negative for environmental allergies and polydipsia. Does not bruise/bleed easily.  Psychiatric/Behavioral: Negative for depression, suicidal ideas, hallucinations, memory loss and substance abuse. The patient is not nervous/anxious and does not have insomnia.        Objective:  Blood pressure 110/70, height 5\' 6"  (1.676 m), weight 270 lb (122.471 kg), last menstrual period 02/27/2014, unknown if currently breastfeeding.   Physical Exam  Vitals reviewed. Constitutional: She is oriented to person, place, and time. She appears well-developed and well-nourished.  HENT:  Head: Normocephalic and atraumatic.        Right Ear: External ear normal.  Left Ear: External ear normal.  Nose: Nose normal.  Mouth/Throat: Oropharynx is clear and moist.  Eyes: Conjunctivae and EOM are normal. Pupils are equal, round, and reactive to light. Right eye exhibits no discharge. Left eye exhibits no discharge. No scleral icterus.  Neck: Normal range of motion. Neck supple. No tracheal deviation present. No thyromegaly present.  Cardiovascular: Normal rate, regular rhythm, normal heart sounds and intact distal pulses.  Exam reveals no gallop and no friction rub.   No murmur heard. Respiratory: Effort normal and breath sounds normal. No respiratory distress. She has no wheezes. She has no rales. She exhibits no tenderness.  GI: Soft. Bowel sounds are normal. She exhibits no distension and no mass. There is no tenderness. There is no rebound and no guarding.  Genitourinary:  Breasts no masses skin changes or nipple changes bilaterally      Vulva is normal without lesions Vagina is pink moist without discharge Cervix normal in appearance and pap is done Uterus is normal size shape and contour Adnexa is negative with normal sized ovaries   Musculoskeletal: Normal range of motion. She exhibits no edema and no tenderness.   Neurological: She is alert and oriented to person, place, and time. She has normal reflexes. She displays normal reflexes. No cranial nerve deficit. She exhibits normal muscle tone. Coordination normal.  Skin: Skin is warm and dry. No rash noted. No erythema. No pallor.  Psychiatric: She has a normal mood and affect. Her behavior is normal. Judgment and thought content normal.       Assessment:    Healthy female exam.    Plan:    Contraception: none. Follow up in: 1 year.    Smoking cessation discussed

## 2014-03-13 ENCOUNTER — Telehealth: Payer: Self-pay | Admitting: Obstetrics & Gynecology

## 2014-03-13 ENCOUNTER — Telehealth: Payer: Self-pay | Admitting: *Deleted

## 2014-03-13 NOTE — Telephone Encounter (Signed)
Pt informed Rx for Albuterol Inhaler sent to [facility], [place], [place]. Pt verbalized understanding.

## 2014-03-13 NOTE — Telephone Encounter (Signed)
Pt requesting albuterol inhaler to be sent [place] [facility] [place]. Rx [facility] [place] per [employer] [facility]. RX previously sent to wrong pharmacy.

## 2014-03-14 LAB — CYTOLOGY - PAP

## 2015-03-12 ENCOUNTER — Ambulatory Visit (INDEPENDENT_AMBULATORY_CARE_PROVIDER_SITE_OTHER): Payer: BLUE CROSS/BLUE SHIELD | Admitting: Obstetrics & Gynecology

## 2015-03-12 ENCOUNTER — Other Ambulatory Visit (HOSPITAL_COMMUNITY)
Admission: RE | Admit: 2015-03-12 | Discharge: 2015-03-12 | Disposition: A | Discharge status: Home / Self Care | Payer: BLUE CROSS/BLUE SHIELD | Source: Ambulatory Visit | Attending: Obstetrics & Gynecology | Admitting: Obstetrics & Gynecology

## 2015-03-12 ENCOUNTER — Encounter: Payer: Self-pay | Admitting: Obstetrics & Gynecology

## 2015-03-12 VITALS — BP 130/80 | HR 86 | Ht 66.0 in | Wt 260.0 lb

## 2015-03-12 DIAGNOSIS — Z01419 Encounter for gynecological examination (general) (routine) without abnormal findings: Secondary | ICD-10-CM

## 2015-03-12 DIAGNOSIS — Z01411 Encounter for gynecological examination (general) (routine) with abnormal findings: Secondary | ICD-10-CM | POA: Diagnosis present

## 2015-03-12 DIAGNOSIS — Z113 Encounter for screening for infections with a predominantly sexual mode of transmission: Secondary | ICD-10-CM | POA: Diagnosis present

## 2015-03-12 NOTE — Progress Notes (Signed)
Patient ID: Latasha [name], female   DOB: 1991/04/23, 24 y.o.   MRN: [id number] Subjective:     Latasha [name] is a 24 y.o. female here for a routine exam.  Patient's last menstrual period was 03/11/2015. G1P0 Birth Control Method:  None  G1P0  Menstrual Calendar(currently): regular monthly  Current complaints: none.   Current acute medical issues:  none   Recent Gynecologic History Patient's last menstrual period was 03/11/2015. Last Pap: 2015,  normal Last mammogram: ,    Past Medical History  Diagnosis Date  . Diabetes mellitus without complication (HCC)   . Asthma     Past Surgical History  Procedure Laterality Date  . Cervical polypectomy      OB History    Gravida Para Term Preterm AB TAB SAB Ectopic Multiple Living   1               Social History   Social History  . Marital Status: Single    Spouse Name: N/A  . Number of Children: N/A  . Years of Education: N/A   Social History Main Topics  . Smoking status: Former Smoker    Types: Cigarettes  . Smokeless tobacco: Never Used  . Alcohol Use: No  . Drug Use: No     Comment: not now  . Sexual Activity: Yes    Birth Control/ Protection: None   Other Topics Concern  . None   Social History Narrative    Family History  Problem Relation Age of Onset  . Cancer Mother     breast, cervical  . Stroke Mother   . Hypertension Mother   . Kidney disease Father   . Stroke Maternal Aunt   . Hypertension Paternal Aunt   . Diabetes Paternal Uncle   . Hypertension Paternal Uncle   . Kidney disease Paternal Uncle   . Hypertension Maternal Grandmother   . Cancer Maternal Grandfather   . Diabetes Paternal Grandmother   . Diabetes Paternal Grandfather   . Hypertension Paternal Uncle      Current outpatient prescriptions:  .  acetaminophen (TYLENOL) 500 MG tablet, Take 1,000 mg by mouth every 6 (six) hours as needed for pain., Disp: , Rfl:  .  albuterol (PROVENTIL HFA;VENTOLIN HFA) 108 (90 BASE) MCG/ACT  inhaler, Inhale 2 puffs into the lungs every 6 (six) hours as needed for wheezing or shortness of breath., Disp: 1 Inhaler, Rfl: 2 .  azithromycin (ZITHROMAX) 1 G powder, Take 1 g by mouth once., Disp: , Rfl:  .  predniSONE (DELTASONE) 10 MG tablet, Take 10 mg by mouth daily., Disp: , Rfl:   Review of Systems  Review of Systems  Constitutional: Negative for fever, chills, weight loss, malaise/fatigue and diaphoresis.  HENT: Negative for hearing loss, ear pain, nosebleeds, congestion, sore throat, neck pain, tinnitus and ear discharge.   Eyes: Negative for blurred vision, double vision, photophobia, pain, discharge and redness.  Respiratory: Negative for cough, hemoptysis, sputum production, shortness of breath, wheezing and stridor.   Cardiovascular: Negative for chest pain, palpitations, orthopnea, claudication, leg swelling and PND.  Gastrointestinal: negative for abdominal pain. Negative for heartburn, nausea, vomiting, diarrhea, constipation, blood in stool and melena.  Genitourinary: Negative for dysuria, urgency, frequency, hematuria and flank pain.  Musculoskeletal: Negative for myalgias, back pain, joint pain and falls.  Skin: Negative for itching and rash.  Neurological: Negative for dizziness, tingling, tremors, sensory change, speech change, focal weakness, seizures, loss of consciousness, weakness and headaches.  Endo/Heme/Allergies: Negative for environmental allergies  and polydipsia. Does not bruise/bleed easily.  Psychiatric/Behavioral: Negative for depression, suicidal ideas, hallucinations, memory loss and substance abuse. The patient is not nervous/anxious and does not have insomnia.        Objective:  Blood pressure 130/80, pulse 86, height 5\' 6"  (1.676 m), weight 260 lb (117.935 kg), last menstrual period 03/11/2015, unknown if currently breastfeeding.   Physical Exam  Vitals reviewed. Constitutional: She is oriented to person, place, and time. She appears well-developed  and well-nourished.  HENT:  Head: Normocephalic and atraumatic.        Right Ear: External ear normal.  Left Ear: External ear normal.  Nose: Nose normal.  Mouth/Throat: Oropharynx is clear and moist.  Eyes: Conjunctivae and EOM are normal. Pupils are equal, round, and reactive to light. Right eye exhibits no discharge. Left eye exhibits no discharge. No scleral icterus.  Neck: Normal range of motion. Neck supple. No tracheal deviation present. No thyromegaly present.  Cardiovascular: Normal rate, regular rhythm, normal heart sounds and intact distal pulses.  Exam reveals no gallop and no friction rub.   No murmur heard. Respiratory: Effort normal and breath sounds normal. No respiratory distress. She has no wheezes. She has no rales. She exhibits no tenderness.  GI: Soft. Bowel sounds are normal. She exhibits no distension and no mass. There is no tenderness. There is no rebound and no guarding.  Genitourinary:  Breasts no masses skin changes or nipple changes bilaterally      Vulva is normal without lesions Vagina is pink moist without discharge Cervix normal in appearance and pap is done Uterus is normal size shape and contour Adnexa is negative with normal sized ovaries   Musculoskeletal: Normal range of motion. She exhibits no edema and no tenderness.  Neurological: She is alert and oriented to person, place, and time. She has normal reflexes. She displays normal reflexes. No cranial nerve deficit. She exhibits normal muscle tone. Coordination normal.  Skin: Skin is warm and dry. No rash noted. No erythema. No pallor.  Psychiatric: She has a normal mood and affect. Her behavior is normal. Judgment and thought content normal.       Assessment:    Healthy female exam.    Plan:    Contraception: none. Follow up in: 1 year.

## 2015-03-14 LAB — CYTOLOGY - PAP

## 2015-06-02 IMAGING — US US OB TRANSVAGINAL
1 series · 14 of 28 positions shown · non-contrast
Comparison: None.

CLINICAL DATA: Six weeks 2 days. Headache. Maternal diabetes.

EXAM:
OBSTETRIC <14 WK US AND TRANSVAGINAL OB US
TECHNIQUE: Both transabdominal and transvaginal ultrasound examinations were
performed for complete evaluation of the gestation as well as the
maternal uterus, adnexal regions, and pelvic cul-de-sac.
Transvaginal technique was performed to assess early pregnancy.

[Series 1: us ob transvaginal · 0.22mm/px · 14 of 49 slices shown]
[im 2/49]
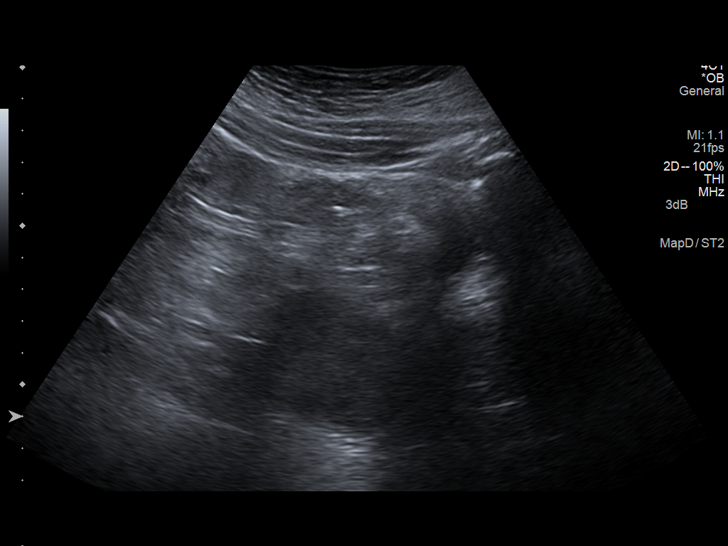
[im 6/49]
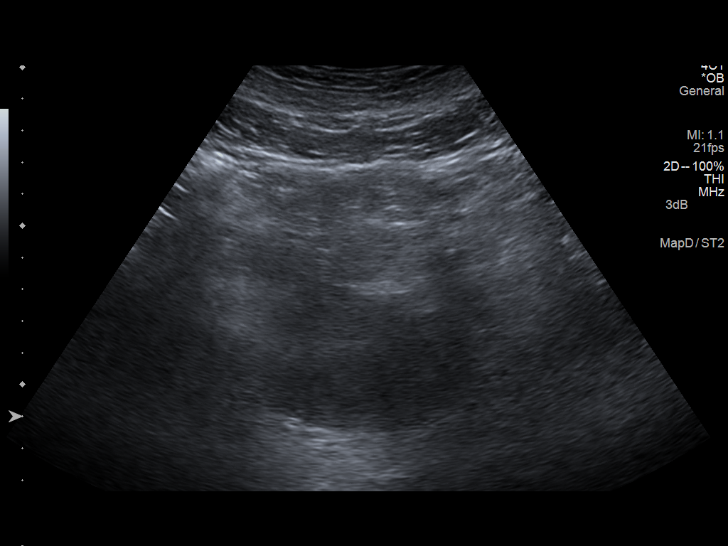
[im 9/49]
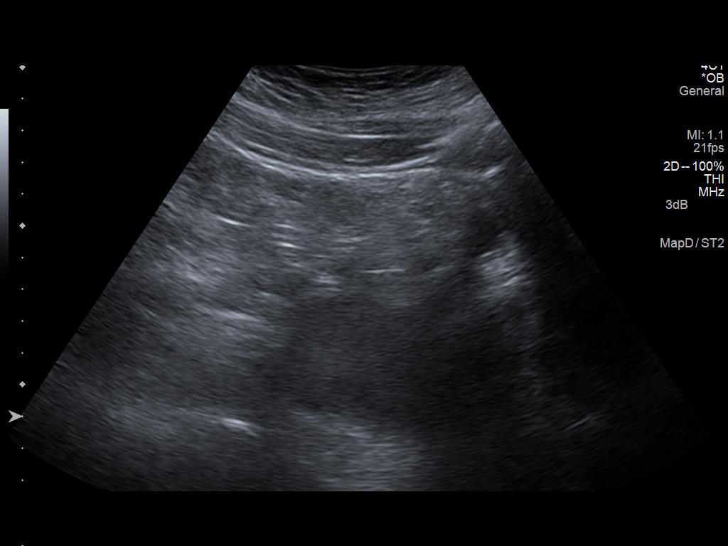
[im 13/49]
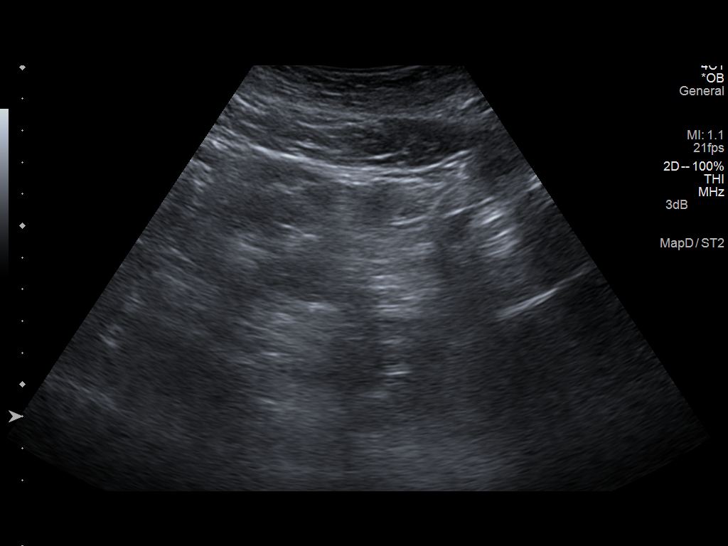
[im 17/49]
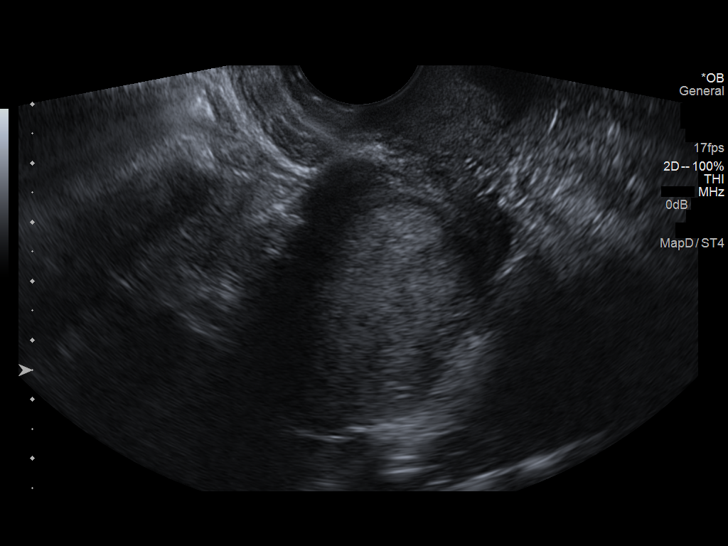
[im 20/49]
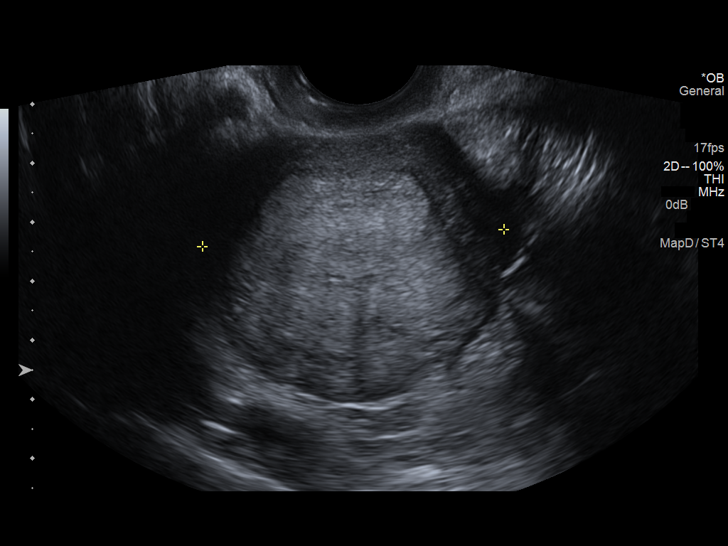
[im 24/49]
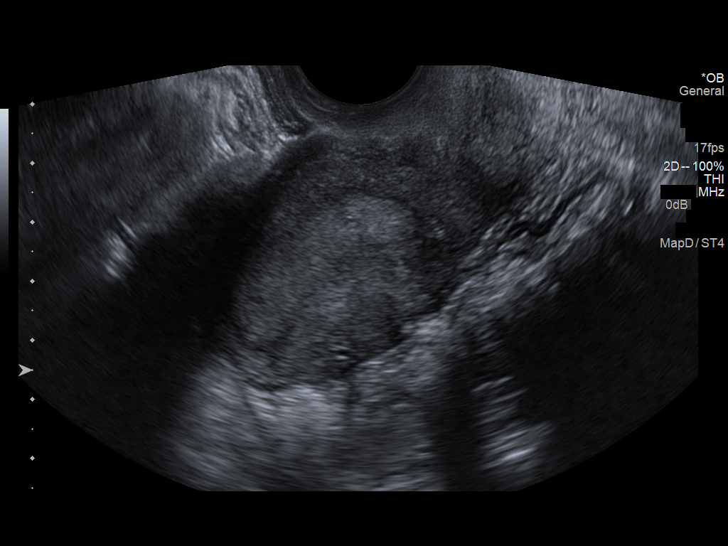
[im 27/49]
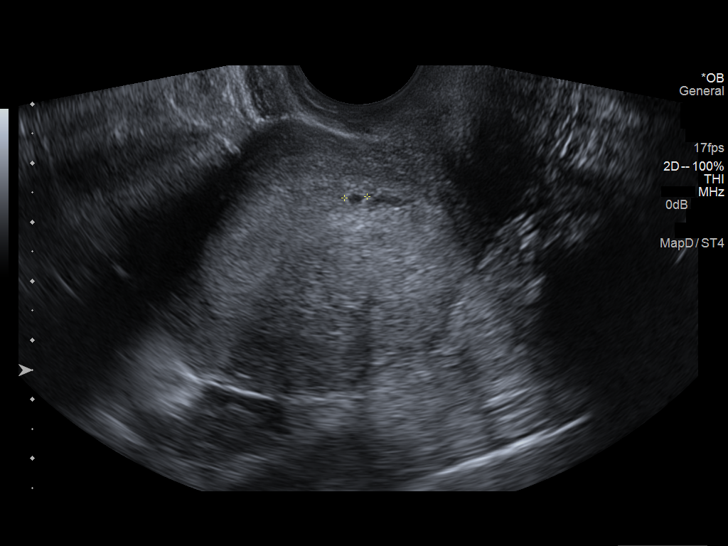
[im 31/49]
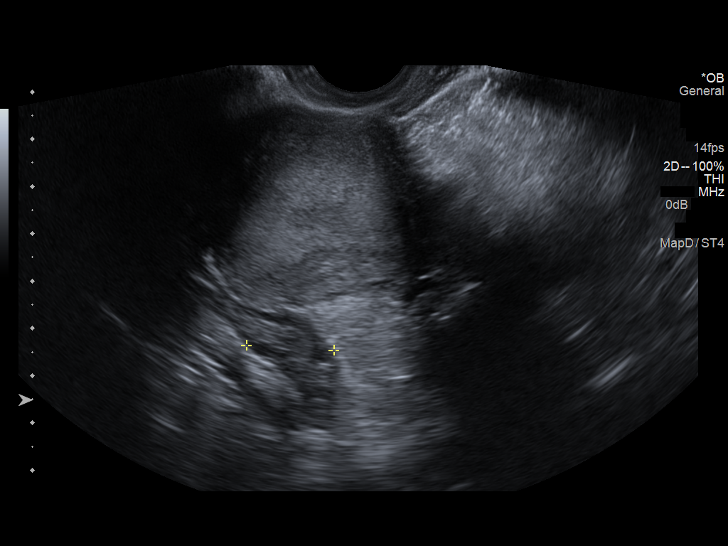
[im 34/49]
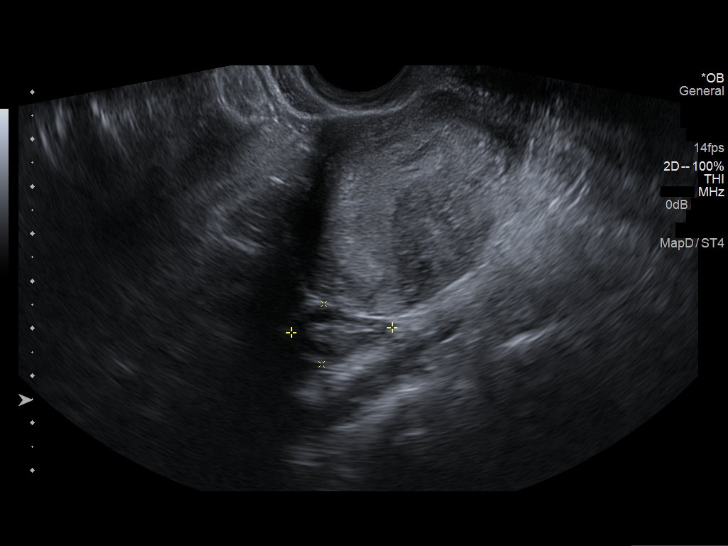
[im 38/49]
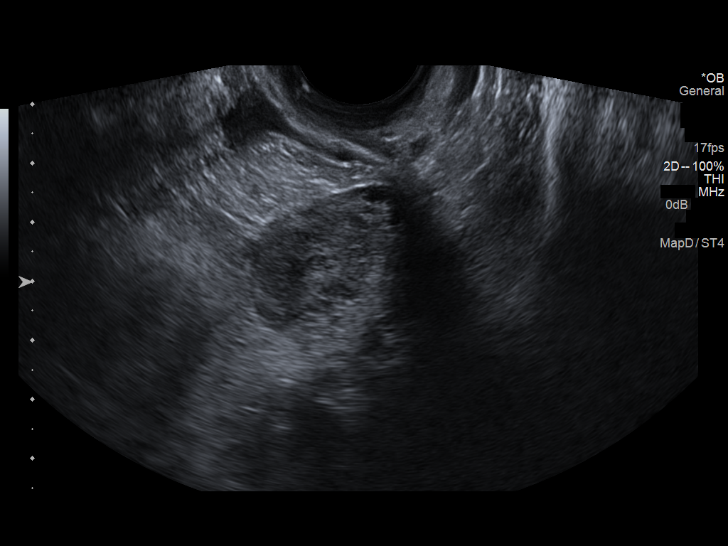
[im 41/49]
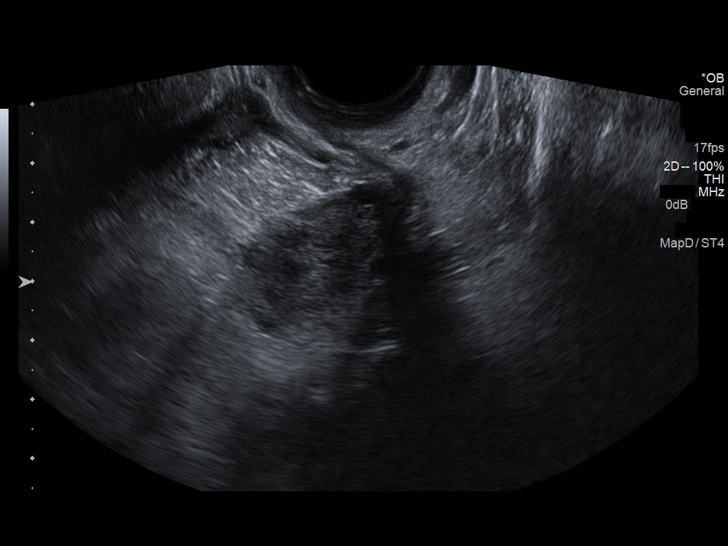
[im 45/49]
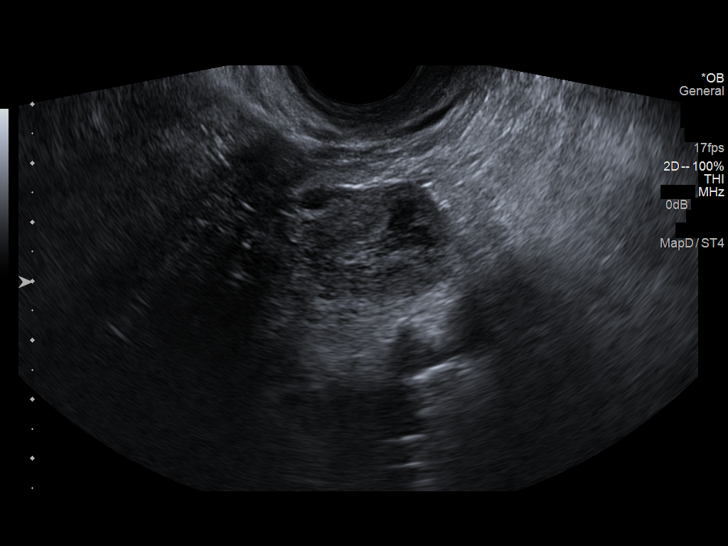
[im 49/49]
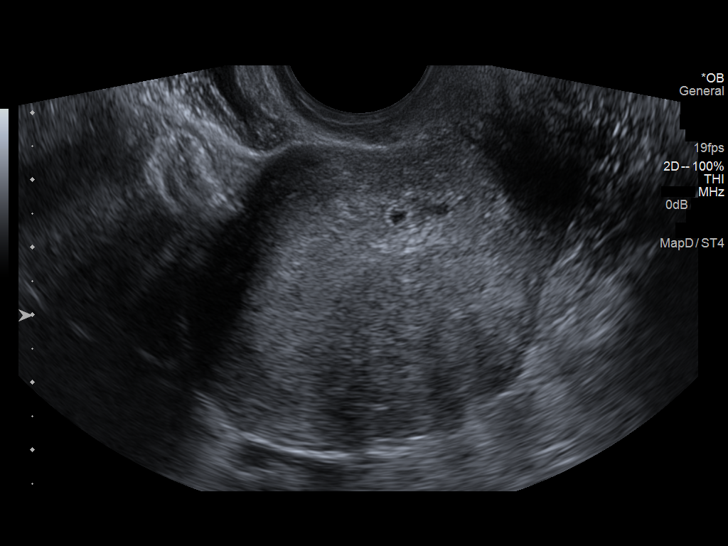

[14 of 28 positions shown; findings below may reference images not displayed]

FINDINGS: Intrauterine gestational sac: Probable single early intrauterine
gestational sac seen

Yolk sac:  Not visualized

Embryo:  Not visualized

MSD:  3  mm   5 w   0  d

Maternal uterus/adnexae: Both ovaries are normal in appearance. No
adnexal mass or free fluid visualized.
IMPRESSION: Probable early intrauterine gestational sac, but no yolk sac, fetal
pole, or cardiac activity yet visualized. Recommend follow-up
quantitative B-HCG levels and follow-up US in 14 days to confirm and
assess viability. This recommendation follows SRU consensus
guidelines: Diagnostic Criteria for Nonviable Pregnancy Early in the
First Trimester. N Engl J Med 1085; [DATE].

## 2016-03-25 ENCOUNTER — Other Ambulatory Visit (HOSPITAL_COMMUNITY)
Admission: RE | Admit: 2016-03-25 | Discharge: 2016-03-25 | Disposition: A | Discharge status: Home / Self Care | Payer: PRIVATE HEALTH INSURANCE | Source: Ambulatory Visit | Attending: Obstetrics & Gynecology | Admitting: Obstetrics & Gynecology

## 2016-03-25 ENCOUNTER — Ambulatory Visit (INDEPENDENT_AMBULATORY_CARE_PROVIDER_SITE_OTHER): Payer: PRIVATE HEALTH INSURANCE | Admitting: Obstetrics & Gynecology

## 2016-03-25 ENCOUNTER — Encounter: Payer: Self-pay | Admitting: Obstetrics & Gynecology

## 2016-03-25 VITALS — BP 130/70 | HR 78 | Ht 66.0 in | Wt 256.0 lb

## 2016-03-25 DIAGNOSIS — Z3202 Encounter for pregnancy test, result negative: Secondary | ICD-10-CM

## 2016-03-25 DIAGNOSIS — Z01419 Encounter for gynecological examination (general) (routine) without abnormal findings: Secondary | ICD-10-CM | POA: Insufficient documentation

## 2016-03-25 LAB — POCT URINE PREGNANCY: PREG TEST UR: NEGATIVE

## 2016-03-25 NOTE — Addendum Note (Signed)
Addended by: Federico FlakeNES, PEGGY A on: 03/25/2016 10:00 AM   Modules accepted: Orders

## 2016-03-25 NOTE — Progress Notes (Signed)
Subjective:     Latasha GarnetCarmisha [name] is a 25 y.o. female here for a routine exam.  Patient's last menstrual period was 02/06/2016. G1P0 Birth Control Method:  none Menstrual Calendar(currently): regular  Current complaints: none.   Current acute medical issues:  none   Recent Gynecologic History Patient's last menstrual period was 02/06/2016. Last Pap: 2016,  normal Last mammogram: ,    Past Medical History:  Diagnosis Date  . Asthma   . Diabetes mellitus without complication [facility](HCC)     Past Surgical History:  Procedure Laterality Date  . CERVICAL POLYPECTOMY      OB History    Gravida Para Term Preterm AB Living   1             SAB TAB Ectopic Multiple Live Births                  Social History   Social History  . Marital status: Single    Spouse name: N/A  . Number of children: N/A  . Years of education: N/A   Social History Main Topics  . Smoking status: Former Smoker    Types: Cigarettes  . Smokeless tobacco: Never Used  . Alcohol use No  . Drug use: No     Comment: not now  . Sexual activity: Yes    Birth control/ protection: None   Other Topics Concern  . None   Social History Narrative  . None    Family History  Problem Relation Age of Onset  . Cancer Mother     breast, cervical  . Stroke Mother   . Hypertension Mother   . Kidney disease Father   . Stroke Maternal Aunt   . Hypertension Paternal Aunt   . Diabetes Paternal Uncle   . Hypertension Paternal Uncle   . Kidney disease Paternal Uncle   . Hypertension Maternal Grandmother   . Cancer Maternal Grandfather   . Diabetes Paternal Grandmother   . Diabetes Paternal Grandfather   . Hypertension Paternal Uncle      Current Outpatient Prescriptions:  .  albuterol (PROVENTIL HFA;VENTOLIN HFA) 108 (90 BASE) MCG/ACT inhaler, Inhale 2 puffs into the lungs every 6 (six) hours as needed for wheezing or shortness of breath., Disp: 1 Inhaler, Rfl: 2  Review of Systems  Review of Systems   Constitutional: Negative for fever, chills, weight loss, malaise/fatigue and diaphoresis.  HENT: Negative for hearing loss, ear pain, nosebleeds, congestion, sore throat, neck pain, tinnitus and ear discharge.   Eyes: Negative for blurred vision, double vision, photophobia, pain, discharge and redness.  Respiratory: Negative for cough, hemoptysis, sputum production, shortness of breath, wheezing and stridor.   Cardiovascular: Negative for chest pain, palpitations, orthopnea, claudication, leg swelling and PND.  Gastrointestinal: negative for abdominal pain. Negative for heartburn, nausea, vomiting, diarrhea, constipation, blood in stool and melena.  Genitourinary: Negative for dysuria, urgency, frequency, hematuria and flank pain.  Musculoskeletal: Negative for myalgias, back pain, joint pain and falls.  Skin: Negative for itching and rash.  Neurological: Negative for dizziness, tingling, tremors, sensory change, speech change, focal weakness, seizures, loss of consciousness, weakness and headaches.  Endo/Heme/Allergies: Negative for environmental allergies and polydipsia. Does not bruise/bleed easily.  Psychiatric/Behavioral: Negative for depression, suicidal ideas, hallucinations, memory loss and substance abuse. The patient is not nervous/anxious and does not have insomnia.        Objective:  Blood pressure 130/70, pulse 78, height 5\' 6"  (1.676 m), weight 256 lb (116.1 kg), last menstrual period 02/06/2016,  unknown if currently breastfeeding.   Physical Exam  Vitals reviewed. Constitutional: She is oriented to person, place, and time. She appears well-developed and well-nourished.  HENT:  Head: Normocephalic and atraumatic.        Right Ear: External ear normal.  Left Ear: External ear normal.  Nose: Nose normal.  Mouth/Throat: Oropharynx is clear and moist.  Eyes: Conjunctivae and EOM are normal. Pupils are equal, round, and reactive to light. Right eye exhibits no discharge. Left eye  exhibits no discharge. No scleral icterus.  Neck: Normal range of motion. Neck supple. No tracheal deviation present. No thyromegaly present.  Cardiovascular: Normal rate, regular rhythm, normal heart sounds and intact distal pulses.  Exam reveals no gallop and no friction rub.   No murmur heard. Respiratory: Effort normal and breath sounds normal. No respiratory distress. She has no wheezes. She has no rales. She exhibits no tenderness.  GI: Soft. Bowel sounds are normal. She exhibits no distension and no mass. There is no tenderness. There is no rebound and no guarding.  Genitourinary:  Breasts no masses skin changes or nipple changes bilaterally      Vulva is normal without lesions Vagina is pink moist without discharge Cervix normal in appearance and pap is done Uterus is normal size shape and contour Adnexa is negative with normal sized ovaries   Musculoskeletal: Normal range of motion. She exhibits no edema and no tenderness.  Neurological: She is alert and oriented to person, place, and time. She has normal reflexes. She displays normal reflexes. No cranial nerve deficit. She exhibits normal muscle tone. Coordination normal.  Skin: Skin is warm and dry. No rash noted. No erythema. No pallor.  Psychiatric: She has a normal mood and affect. Her behavior is normal. Judgment and thought content normal.       Medications Ordered at today's visit: No orders of the defined types were placed in this encounter.   Other orders placed at today's visit: Orders Placed This Encounter  Procedures  . POCT urine pregnancy      Assessment:    Healthy female exam.    Plan:    Contraception: none. Follow up in: 1 year.     No Follow-up on file.

## 2016-03-26 LAB — CYTOLOGY - PAP: Diagnosis: NEGATIVE

## 2017-03-27 ENCOUNTER — Other Ambulatory Visit (HOSPITAL_COMMUNITY)
Admission: RE | Admit: 2017-03-27 | Discharge: 2017-03-27 | Disposition: A | Discharge status: Home / Self Care | Payer: PRIVATE HEALTH INSURANCE | Source: Ambulatory Visit | Attending: Obstetrics & Gynecology | Admitting: Obstetrics & Gynecology

## 2017-03-27 ENCOUNTER — Other Ambulatory Visit: Payer: Self-pay

## 2017-03-27 ENCOUNTER — Ambulatory Visit (INDEPENDENT_AMBULATORY_CARE_PROVIDER_SITE_OTHER): Payer: PRIVATE HEALTH INSURANCE | Admitting: Obstetrics & Gynecology

## 2017-03-27 ENCOUNTER — Encounter: Payer: Self-pay | Admitting: Obstetrics & Gynecology

## 2017-03-27 VITALS — BP 124/78 | HR 77 | Ht 66.0 in | Wt 261.0 lb

## 2017-03-27 DIAGNOSIS — Z01419 Encounter for gynecological examination (general) (routine) without abnormal findings: Secondary | ICD-10-CM | POA: Diagnosis not present

## 2017-03-27 NOTE — Progress Notes (Signed)
Subjective:     Latasha [name] is a 26 y.o. female here for a routine exam.  Patient's last menstrual period was 03/10/2017 (exact date). G1P0010 Birth Control Method:  none Menstrual Calendar(currently):  A bit irregular 30-45 days cycle  Current complaints: trying to get pregnant.   Current acute medical issues:  Stopped smoking asthma is better   Recent Gynecologic History Patient's last menstrual period was 03/10/2017 (exact date). Last Pap: 2017,  normal Last mammogram: ,    Past Medical History:  Diagnosis Date  . Asthma   . Diabetes mellitus without complication [facility](HCC)     Past Surgical History:  Procedure Laterality Date  . CERVICAL POLYPECTOMY      OB History    Gravida Para Term Preterm AB Living   1       1 0   SAB TAB Ectopic Multiple Live Births   1              Social History   Socioeconomic History  . Marital status: Single    Spouse name: None  . Number of children: None  . Years of education: None  . Highest education level: None  Social Needs  . Financial resource strain: None  . Food insecurity - worry: None  . Food insecurity - inability: None  . Transportation needs - medical: None  . Transportation needs - non-medical: None  Occupational History  . None  Tobacco Use  . Smoking status: Former Smoker    Types: Cigarettes  . Smokeless tobacco: Never Used  Substance and Sexual Activity  . Alcohol use: No    Alcohol/week: 0.0 oz  . Drug use: No    Comment: not now  . Sexual activity: Yes    Birth control/protection: None  Other Topics Concern  . None  Social History Narrative  . None    Family History  Problem Relation Age of Onset  . Cancer Mother        breast, cervical  . Stroke Mother   . Hypertension Mother   . Kidney disease Father   . Stroke Maternal Aunt   . Hypertension Paternal Aunt   . Diabetes Paternal Uncle   . Hypertension Paternal Uncle   . Kidney disease Paternal Uncle   . Hypertension Maternal Grandmother    . Cancer Maternal Grandfather   . Diabetes Paternal Grandmother   . Diabetes Paternal Grandfather   . Hypertension Paternal Uncle      Current Outpatient Medications:  .  albuterol (PROVENTIL HFA;VENTOLIN HFA) 108 (90 BASE) MCG/ACT inhaler, Inhale 2 puffs into the lungs every 6 (six) hours as needed for wheezing or shortness of breath., Disp: 1 Inhaler, Rfl: 2  Review of Systems  Review of Systems  Constitutional: Negative for fever, chills, weight loss, malaise/fatigue and diaphoresis.  HENT: Negative for hearing loss, ear pain, nosebleeds, congestion, sore throat, neck pain, tinnitus and ear discharge.   Eyes: Negative for blurred vision, double vision, photophobia, pain, discharge and redness.  Respiratory: Negative for cough, hemoptysis, sputum production, shortness of breath, wheezing and stridor.   Cardiovascular: Negative for chest pain, palpitations, orthopnea, claudication, leg swelling and PND.  Gastrointestinal: negative for abdominal pain. Negative for heartburn, nausea, vomiting, diarrhea, constipation, blood in stool and melena.  Genitourinary: Negative for dysuria, urgency, frequency, hematuria and flank pain.  Musculoskeletal: Negative for myalgias, back pain, joint pain and falls.  Skin: Negative for itching and rash.  Neurological: Negative for dizziness, tingling, tremors, sensory change, speech change, focal weakness,  seizures, loss of consciousness, weakness and headaches.  Endo/Heme/Allergies: Negative for environmental allergies and polydipsia. Does not bruise/bleed easily.  Psychiatric/Behavioral: Negative for depression, suicidal ideas, hallucinations, memory loss and substance abuse. The patient is not nervous/anxious and does not have insomnia.        Objective:  Blood pressure 124/78, pulse 77, height 5\' 6"  (1.676 m), weight 261 lb (118.4 kg), last menstrual period 03/10/2017, unknown if currently breastfeeding.   Physical Exam  Vitals  reviewed. Constitutional: She is oriented to person, place, and time. She appears well-developed and well-nourished.  HENT:  Head: Normocephalic and atraumatic.        Right Ear: External ear normal.  Left Ear: External ear normal.  Nose: Nose normal.  Mouth/Throat: Oropharynx is clear and moist.  Eyes: Conjunctivae and EOM are normal. Pupils are equal, round, and reactive to light. Right eye exhibits no discharge. Left eye exhibits no discharge. No scleral icterus.  Neck: Normal range of motion. Neck supple. No tracheal deviation present. No thyromegaly present.  Cardiovascular: Normal rate, regular rhythm, normal heart sounds and intact distal pulses.  Exam reveals no gallop and no friction rub.   No murmur heard. Respiratory: Effort normal and breath sounds normal. No respiratory distress. She has no wheezes. She has no rales. She exhibits no tenderness.  GI: Soft. Bowel sounds are normal. She exhibits no distension and no mass. There is no tenderness. There is no rebound and no guarding.  Genitourinary:  Breasts no masses skin changes or nipple changes bilaterally      Vulva is normal without lesions Vagina is pink moist without discharge Cervix normal in appearance and pap is done Uterus is normal size shape and contour Adnexa is negative with normal sized ovaries   Musculoskeletal: Normal range of motion. She exhibits no edema and no tenderness.  Neurological: She is alert and oriented to person, place, and time. She has normal reflexes. She displays normal reflexes. No cranial nerve deficit. She exhibits normal muscle tone. Coordination normal.  Skin: Skin is warm and dry. No rash noted. No erythema. No pallor.  Psychiatric: She has a normal mood and affect. Her behavior is normal. Judgment and thought content normal.       Medications Ordered at today's visit: No orders of the defined types were placed in this encounter.   Other orders placed at today's visit: No orders of  the defined types were placed in this encounter.     Assessment:    Healthy female exam.    Plan:    Contraception: none. Follow up in: 1 years.     Return in about 1 year (around 03/27/2018) for yearly, with Dr Despina HiddenEure. Subjective:

## 2017-03-31 LAB — CYTOLOGY - PAP: HPV (WINDOPATH): DETECTED — AB

## 2017-04-13 ENCOUNTER — Encounter: Payer: Self-pay | Admitting: Obstetrics & Gynecology

## 2017-04-13 ENCOUNTER — Ambulatory Visit (INDEPENDENT_AMBULATORY_CARE_PROVIDER_SITE_OTHER): Payer: PRIVATE HEALTH INSURANCE | Admitting: Obstetrics & Gynecology

## 2017-04-13 ENCOUNTER — Other Ambulatory Visit: Payer: Self-pay

## 2017-04-13 VITALS — BP 132/84 | HR 87 | Ht 66.0 in | Wt 263.0 lb

## 2017-04-13 DIAGNOSIS — N979 Female infertility, unspecified: Secondary | ICD-10-CM | POA: Diagnosis not present

## 2017-05-07 ENCOUNTER — Telehealth: Payer: Self-pay | Admitting: *Deleted

## 2017-05-07 ENCOUNTER — Encounter: Payer: Self-pay | Admitting: Obstetrics & Gynecology

## 2017-05-07 DIAGNOSIS — N979 Female infertility, unspecified: Secondary | ICD-10-CM

## 2017-05-08 ENCOUNTER — Encounter: Payer: Self-pay | Admitting: *Deleted

## 2017-05-08 NOTE — Telephone Encounter (Signed)
Attempted to call patient but number not working. Mychart message sent. Scheduled HSG on 1/18 @ 9:30.

## 2017-05-08 NOTE — Telephone Encounter (Signed)
Patient scheduled for HSG on 9:30 Friday 1/18 at 9:30. She is to arrive at [facility]9am

## 2017-05-15 ENCOUNTER — Ambulatory Visit (HOSPITAL_COMMUNITY)
Admission: RE | Admit: 2017-05-15 | Discharge: 2017-05-15 | Disposition: A | Discharge status: Home / Self Care | Payer: PRIVATE HEALTH INSURANCE | Source: Ambulatory Visit | Attending: Obstetrics & Gynecology | Admitting: Obstetrics & Gynecology

## 2017-05-15 DIAGNOSIS — N979 Female infertility, unspecified: Secondary | ICD-10-CM | POA: Insufficient documentation

## 2017-05-15 MED ORDER — IOPAMIDOL (ISOVUE-300) INJECTION 61%
50.0000 mL | Freq: Once | INTRAVENOUS | Status: AC | PRN
  Administered 2017-05-15: 20 mL

## 2017-05-15 MED ORDER — POVIDONE-IODINE 10 % EX SOLN
CUTANEOUS | Status: AC
  Filled 2017-05-15: qty 15

## 2017-05-15 MED ORDER — IOPAMIDOL (ISOVUE-300) INJECTION 61%
INTRAVENOUS | Status: AC
  Filled 2017-05-15: qty 50

## 2017-06-08 ENCOUNTER — Encounter: Payer: Self-pay | Admitting: Obstetrics & Gynecology

## 2017-06-24 ENCOUNTER — Encounter: Payer: Self-pay | Admitting: Obstetrics & Gynecology

## 2017-06-24 MED ORDER — CLOMIPHENE CITRATE 50 MG PO TABS: ORAL_TABLET | ORAL | 5 refills | Status: DC

## 2017-06-24 NOTE — Progress Notes (Signed)
Chief Complaint  Patient presents with  . Follow-up      27 y.o. G1P0010 Patient's last menstrual period was 04/03/2017. The current method of family planning is none.  Outpatient Encounter Medications as of 04/13/2017  Medication Sig  . albuterol (PROVENTIL HFA;VENTOLIN HFA) 108 (90 BASE) MCG/ACT inhaler Inhale 2 puffs into the lungs every 6 (six) hours as needed for wheezing or shortness of breath. (Patient not taking: Reported on 04/13/2017)  . clomiPHENE (CLOMID) 50 MG tablet Take 1 tablet daily as directed for 5 days   No facility-administered encounter medications on file as of 04/13/2017.     Subjective Patient presents to discuss trying to get pregnant She has somewhat irregular periods every 30-45 days Partner has fathered children She has never had a pelvic inflammatory disease that she knows of She has no dyspareunia And again she is only had the one pregnancy in the past First order of business is of course to check tubal patency on this patient and then proceed from there Past Medical History:  Diagnosis Date  . Asthma   . Diabetes mellitus without complication [facility])     Past Surgical History:  Procedure Laterality Date  . CERVICAL POLYPECTOMY      OB History    Gravida Para Term Preterm AB Living   1       1 0   SAB TAB Ectopic Multiple Live Births   1              No Known Allergies  Social History   Socioeconomic History  . Marital status: Single    Spouse name: None  . Number of children: None  . Years of education: None  . Highest education level: None  Social Needs  . Financial resource strain: None  . Food insecurity - worry: None  . Food insecurity - inability: None  . Transportation needs - medical: None  . Transportation needs - non-medical: None  Occupational History  . None  Tobacco Use  . Smoking status: Former Smoker    Types: Cigarettes  . Smokeless tobacco: Never Used  Substance and Sexual Activity  . Alcohol  use: No    Alcohol/week: 0.0 oz  . Drug use: No    Comment: not now  . Sexual activity: Yes    Birth control/protection: None  Other Topics Concern  . None  Social History Narrative  . None    Family History  Problem Relation Age of Onset  . Cancer Mother        breast, cervical  . Stroke Mother   . Hypertension Mother   . Kidney disease Father   . Stroke Maternal Aunt   . Hypertension Paternal Aunt   . Diabetes Paternal Uncle   . Hypertension Paternal Uncle   . Kidney disease Paternal Uncle   . Hypertension Maternal Grandmother   . Cancer Maternal Grandfather   . Diabetes Paternal Grandmother   . Diabetes Paternal Grandfather   . Hypertension Paternal Uncle   . Anuerysm Brother     Medications:       Current Outpatient Medications:  .  albuterol (PROVENTIL HFA;VENTOLIN HFA) 108 (90 BASE) MCG/ACT inhaler, Inhale 2 puffs into the lungs every 6 (six) hours as needed for wheezing or shortness of breath. (Patient not taking: Reported on 04/13/2017), Disp: 1 Inhaler, Rfl: 2 .  clomiPHENE (CLOMID) 50 MG tablet, Take 1 tablet daily as directed for 5 days, Disp: 5 tablet, Rfl: 5  Objective  Blood pressure 132/84, pulse 87, height 5\' 6"  (1.676 m), weight 263 lb (119.3 kg), last menstrual period 04/03/2017, unknown if currently breastfeeding.  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 clomiPHENE (CLOMID) 50 MG tablet    Sig: Take 1 tablet daily as directed for 5 days    Dispense:  5 tablet    Refill:  5    Labs or Scans Ordered: No orders of the defined types were placed in  this encounter.   Management:: Patient will call me with her cycle and we will schedule her for hysterosalpingogram at the appropriate time  Follow up Return if symptoms worsen or fail to improve.        Face to face time:  15 minutes  Greater than 50% of the visit time was spent in counseling and coordination of care with the patient.  The summary and outline of the counseling and care coordination is summarized in the note above.   All questions were answered.

## 2017-07-31 ENCOUNTER — Encounter: Payer: Self-pay | Admitting: Obstetrics & Gynecology

## 2017-08-17 ENCOUNTER — Other Ambulatory Visit: Payer: Self-pay | Admitting: Obstetrics & Gynecology

## 2017-08-17 DIAGNOSIS — N979 Female infertility, unspecified: Secondary | ICD-10-CM

## 2017-08-18 ENCOUNTER — Ambulatory Visit: Payer: PRIVATE HEALTH INSURANCE

## 2017-08-18 ENCOUNTER — Ambulatory Visit: Payer: PRIVATE HEALTH INSURANCE | Admitting: Obstetrics & Gynecology

## 2018-07-20 ENCOUNTER — Other Ambulatory Visit: Payer: Self-pay | Admitting: Surgery

## 2019-07-28 HISTORY — PX: GALLBLADDER SURGERY: SHX652

## 2019-10-06 ENCOUNTER — Other Ambulatory Visit: Payer: PRIVATE HEALTH INSURANCE | Admitting: Obstetrics & Gynecology

## 2019-10-20 ENCOUNTER — Other Ambulatory Visit (HOSPITAL_COMMUNITY)
Admission: RE | Admit: 2019-10-20 | Discharge: 2019-10-20 | Disposition: A | Discharge status: Home / Self Care | Payer: Medicaid Other | Source: Ambulatory Visit | Attending: Obstetrics & Gynecology | Admitting: Obstetrics & Gynecology

## 2019-10-20 ENCOUNTER — Ambulatory Visit (INDEPENDENT_AMBULATORY_CARE_PROVIDER_SITE_OTHER): Payer: Self-pay | Admitting: Obstetrics & Gynecology

## 2019-10-20 ENCOUNTER — Encounter: Payer: Self-pay | Admitting: Obstetrics & Gynecology

## 2019-10-20 ENCOUNTER — Other Ambulatory Visit: Payer: Self-pay | Admitting: Obstetrics & Gynecology

## 2019-10-20 VITALS — BP 143/87 | HR 69 | Ht 66.0 in | Wt 216.0 lb

## 2019-10-20 DIAGNOSIS — Z3009 Encounter for other general counseling and advice on contraception: Secondary | ICD-10-CM | POA: Diagnosis present

## 2019-10-20 DIAGNOSIS — Z0000 Encounter for general adult medical examination without abnormal findings: Secondary | ICD-10-CM | POA: Diagnosis present

## 2019-10-20 DIAGNOSIS — Z01419 Encounter for gynecological examination (general) (routine) without abnormal findings: Secondary | ICD-10-CM

## 2019-10-20 NOTE — Progress Notes (Signed)
Subjective:     Latasha [name] is a 29 y.o. female here for a routine exam.  Patient's last menstrual period was 10/14/2019. G1P0010 Birth Control Method:  none Menstrual Calendar(currently): regular  Current complaints: none.   Current acute medical issues:  none   Recent Gynecologic History Patient's last menstrual period was 10/14/2019. Last Pap: 2018,  LSIL Last mammogram: n/a,    Past Medical History:  Diagnosis Date  . Asthma   . Diabetes mellitus without complication [facility])     Past Surgical History:  Procedure Laterality Date  . CERVICAL POLYPECTOMY    . GALLBLADDER SURGERY  07/2019    OB History    Gravida  1   Para      Term      Preterm      AB  1   Living  0     SAB  1   TAB      Ectopic      Multiple      Live Births              Social History   Socioeconomic History  . Marital status: Married    Spouse name: Not on file  . Number of children: Not on file  . Years of education: Not on file  . Highest education level: Not on file  Occupational History  . Not on file  Tobacco Use  . Smoking status: Former Smoker    Types: Cigarettes  . Smokeless tobacco: Never Used  Vaping Use  . Vaping Use: Never used  Substance and Sexual Activity  . Alcohol use: No    Alcohol/week: 0.0 standard drinks  . Drug use: No    Types: Marijuana    Comment: not now  . Sexual activity: Yes    Birth control/protection: None  Other Topics Concern  . Not on file  Social History Narrative  . Not on file   Social Determinants of Health   Financial Resource Strain: Low Risk   . Difficulty of Paying Living Expenses: Not very hard  Food Insecurity: Food Insecurity Present  . Worried About Programme researcher, broadcasting/film/video in the Last Year: Sometimes true  . Ran Out of Food in the Last Year: Sometimes true  Transportation Needs: No Transportation Needs  . Lack of Transportation (Medical): No  . Lack of Transportation (Non-Medical): No  Physical Activity:  Sufficiently Active  . Days of Exercise per Week: 6 days  . Minutes of Exercise per Session: 60 min  Stress: Stress Concern Present  . Feeling of Stress : To some extent  Social Connections: Moderately Integrated  . Frequency of Communication with Friends and Family: More than three times a week  . Frequency of Social Gatherings with Friends and Family: Once a week  . Attends Religious Services: More than 4 times per year  . Active Member of Clubs or Organizations: No  . Attends Banker Meetings: Never  . Marital Status: Married    Family History  Problem Relation Age of Onset  . Cancer Mother        breast, cervical  . Stroke Mother   . Hypertension Mother   . Kidney disease Father   . Stroke Maternal Aunt   . Hypertension Paternal Aunt   . Diabetes Paternal Uncle   . Hypertension Paternal Uncle   . Kidney disease Paternal Uncle   . Hypertension Maternal Grandmother   . Cancer Maternal Grandfather   . Diabetes Paternal Grandmother   .  Diabetes Paternal Grandfather   . Hypertension Paternal Uncle   . Anuerysm Brother      Current Outpatient Medications:  .  albuterol (PROVENTIL HFA;VENTOLIN HFA) 108 (90 BASE) MCG/ACT inhaler, Inhale 2 puffs into the lungs every 6 (six) hours as needed for wheezing or shortness of breath., Disp: 1 Inhaler, Rfl: 2 .  omeprazole (PRILOSEC) 20 MG capsule, Take 20 mg by mouth daily., Disp: , Rfl:  .  ondansetron (ZOFRAN-ODT) 4 MG disintegrating tablet, Take 4 mg by mouth every 6 (six) hours as needed., Disp: , Rfl:   Review of Systems  Review of Systems  Constitutional: Negative for fever, chills, weight loss, malaise/fatigue and diaphoresis.  HENT: Negative for hearing loss, ear pain, nosebleeds, congestion, sore throat, neck pain, tinnitus and ear discharge.   Eyes: Negative for blurred vision, double vision, photophobia, pain, discharge and redness.  Respiratory: Negative for cough, hemoptysis, sputum production, shortness of  breath, wheezing and stridor.   Cardiovascular: Negative for chest pain, palpitations, orthopnea, claudication, leg swelling and PND.  Gastrointestinal: negative for abdominal pain. Negative for heartburn, nausea, vomiting, diarrhea, constipation, blood in stool and melena.  Genitourinary: Negative for dysuria, urgency, frequency, hematuria and flank pain.  Musculoskeletal: Negative for myalgias, back pain, joint pain and falls.  Skin: Negative for itching and rash.  Neurological: Negative for dizziness, tingling, tremors, sensory change, speech change, focal weakness, seizures, loss of consciousness, weakness and headaches.  Endo/Heme/Allergies: Negative for environmental allergies and polydipsia. Does not bruise/bleed easily.  Psychiatric/Behavioral: Negative for depression, suicidal ideas, hallucinations, memory loss and substance abuse. The patient is not nervous/anxious and does not have insomnia.        Objective:  Blood pressure (!) 143/87, pulse 69, height 5\' 6"  (1.676 m), weight 216 lb (98 kg), last menstrual period 10/14/2019, not currently breastfeeding.   Physical Exam  Vitals reviewed. Constitutional: She is oriented to person, place, and time. She appears well-developed and well-nourished.  HENT:  Head: Normocephalic and atraumatic.        Right Ear: External ear normal.  Left Ear: External ear normal.  Nose: Nose normal.  Mouth/Throat: Oropharynx is clear and moist.  Eyes: Conjunctivae and EOM are normal. Pupils are equal, round, and reactive to light. Right eye exhibits no discharge. Left eye exhibits no discharge. No scleral icterus.  Neck: Normal range of motion. Neck supple. No tracheal deviation present. No thyromegaly present.  Cardiovascular: Normal rate, regular rhythm, normal heart sounds and intact distal pulses.  Exam reveals no gallop and no friction rub.   No murmur heard. Respiratory: Effort normal and breath sounds normal. No respiratory distress. She has no  wheezes. She has no rales. She exhibits no tenderness.  GI: Soft. Bowel sounds are normal. She exhibits no distension and no mass. There is no tenderness. There is no rebound and no guarding.  Genitourinary:  Breasts no masses skin changes or nipple changes bilaterally      Vulva is normal without lesions Vagina is pink moist without discharge Cervix normal in appearance and pap is done Uterus is normal size shape and contour Adnexa is negative with normal sized ovaries   Musculoskeletal: Normal range of motion. She exhibits no edema and no tenderness.  Neurological: She is alert and oriented to person, place, and time. She has normal reflexes. She displays normal reflexes. No cranial nerve deficit. She exhibits normal muscle tone. Coordination normal.  Skin: Skin is warm and dry. No rash noted. No erythema. No pallor.  Psychiatric: She has a  normal mood and affect. Her behavior is normal. Judgment and thought content normal.       Medications Ordered at today's visit: No orders of the defined types were placed in this encounter.   Other orders placed at today's visit: Orders Placed This Encounter  Procedures  . HIV Antibody (routine testing w rflx)  . RPR      Assessment:    Normal Gyn exam.    Plan:    Contraception: none. Follow up in: 1-3 years.     Return in about 1 year (around 10/19/2020) for yearly.

## 2019-10-21 LAB — RPR: RPR Ser Ql: NONREACTIVE

## 2019-10-21 LAB — HIV ANTIBODY (ROUTINE TESTING W REFLEX): HIV Screen 4th Generation wRfx: NONREACTIVE

## 2019-10-26 LAB — CYTOLOGY - PAP
Chlamydia: NEGATIVE
Comment: NEGATIVE
Comment: NEGATIVE
Comment: NEGATIVE
Comment: NORMAL
Diagnosis: NEGATIVE
HPV 16: NEGATIVE
HPV 18 / 45: NEGATIVE
High risk HPV: POSITIVE — AB
Neisseria Gonorrhea: NEGATIVE

## 2019-10-26 LAB — CERVICOVAGINAL ANCILLARY ONLY: HPV 16/18/45 genotyping: NEGATIVE

## 2021-04-28 NOTE — L&D Delivery Note (Cosign Needed Addendum)
OB/GYN Faculty Practice Delivery Note  Latasha [name] is a 31 y.o. G2P0010 s/p SVD at [redacted]w[redacted]d. She was admitted for IOL for gHTN.   ROM: 4h 18m with clear fluid GBS Status: pos- treated w/ pcn Maximum Maternal Temperature: 98.1  Labor Progress: Progressed quickly from 4 to complete over 4-5 hours  Delivery Date/Time: 02/25/22, 9735 Delivery: Called to room and patient was complete and pushing. Head delivered ROA with compound hand. No nuchal cord present. Shoulder and body delivered in usual fashion. Infant with spontaneous cry, placed on mother's abdomen, dried and stimulated. Cord clamped x 2 after 1-minute delay, and cut by grandmother under my direct supervision. Cord blood drawn. Placenta delivered spontaneously with gentle cord traction. Fundus firm with massage and Pitocin. Labia, perineum, vagina, and cervix were inspected, small hemostatic abrasions noted right periurethral and superior to clitoris.   Placenta: complete, three vessel cord appreciated Complications: none Lacerations: as above, no perineal lacerations EBL: 47 mL Analgesia: epidural (placed less than an hour before delivery)   Infant: female  APGARs 9/9  weight pending  Cecilio Asper, [place] [facility] [place], [place]   Fellow ATTESTATION  I was present and gloved for this delivery and agree with the above documentation in the resident's note except as below.   Concepcion Living, MD [facility] [employer] (Faculty Practice) 02/25/2022, 9:57 PM

## 2021-07-15 ENCOUNTER — Other Ambulatory Visit: Payer: Self-pay | Admitting: Obstetrics & Gynecology

## 2021-07-15 ENCOUNTER — Ambulatory Visit (INDEPENDENT_AMBULATORY_CARE_PROVIDER_SITE_OTHER): Payer: Medicaid Other

## 2021-07-15 ENCOUNTER — Other Ambulatory Visit: Payer: Medicaid Other

## 2021-07-15 ENCOUNTER — Other Ambulatory Visit: Payer: Self-pay

## 2021-07-15 DIAGNOSIS — O3680X0 Pregnancy with inconclusive fetal viability, not applicable or unspecified: Secondary | ICD-10-CM | POA: Diagnosis not present

## 2021-07-15 NOTE — Progress Notes (Signed)
[place] 6+4 wks,single IUP with YS,CRL 6.37 mm,FHR 123 bpm,normal left ovary,complex right corpus luteal cyst 2.7 x 2.4 x 2.1 cm ?

## 2021-08-23 ENCOUNTER — Other Ambulatory Visit: Payer: Self-pay | Admitting: Obstetrics & Gynecology

## 2021-08-23 DIAGNOSIS — Z3490 Encounter for supervision of normal pregnancy, unspecified, unspecified trimester: Secondary | ICD-10-CM | POA: Insufficient documentation

## 2021-08-23 DIAGNOSIS — Z3682 Encounter for antenatal screening for nuchal translucency: Secondary | ICD-10-CM

## 2021-08-26 ENCOUNTER — Ambulatory Visit (INDEPENDENT_AMBULATORY_CARE_PROVIDER_SITE_OTHER): Payer: Medicaid Other

## 2021-08-26 ENCOUNTER — Encounter: Payer: Self-pay | Admitting: Women's Health

## 2021-08-26 ENCOUNTER — Ambulatory Visit: Payer: Medicaid Other | Admitting: *Deleted

## 2021-08-26 ENCOUNTER — Ambulatory Visit (INDEPENDENT_AMBULATORY_CARE_PROVIDER_SITE_OTHER): Payer: Medicaid Other | Admitting: Women's Health

## 2021-08-26 VITALS — BP 131/74 | HR 91 | Wt 226.0 lb

## 2021-08-26 DIAGNOSIS — Z3A12 12 weeks gestation of pregnancy: Secondary | ICD-10-CM

## 2021-08-26 DIAGNOSIS — Z3480 Encounter for supervision of other normal pregnancy, unspecified trimester: Secondary | ICD-10-CM

## 2021-08-26 DIAGNOSIS — Z6834 Body mass index (BMI) 34.0-34.9, adult: Secondary | ICD-10-CM

## 2021-08-26 DIAGNOSIS — Z3682 Encounter for antenatal screening for nuchal translucency: Secondary | ICD-10-CM | POA: Diagnosis not present

## 2021-08-26 DIAGNOSIS — Z3481 Encounter for supervision of other normal pregnancy, first trimester: Secondary | ICD-10-CM

## 2021-08-26 DIAGNOSIS — Z8719 Personal history of other diseases of the digestive system: Secondary | ICD-10-CM

## 2021-08-26 LAB — POCT URINALYSIS DIPSTICK OB
Blood, UA: NEGATIVE
Glucose, UA: NEGATIVE
Ketones, UA: NEGATIVE
Leukocytes, UA: NEGATIVE
Nitrite, UA: NEGATIVE

## 2021-08-26 MED ORDER — ASPIRIN 81 MG PO TBEC: 81.0000 mg | DELAYED_RELEASE_TABLET | Freq: Every day | ORAL | 3 refills | Status: DC

## 2021-08-26 MED ORDER — DOXYLAMINE-PYRIDOXINE 10-10 MG PO TBEC: DELAYED_RELEASE_TABLET | ORAL | 6 refills | Status: DC

## 2021-08-26 NOTE — Patient Instructions (Signed)
Shantee, thank you for choosing our office today! We appreciate the opportunity to meet your healthcare needs. You may receive a short survey by mail, e-mail, or through [employer]. If you are happy with your care we would appreciate if you could take just a few minutes to complete the survey questions. We read all of your comments and take your feedback very seriously. Thank you again for choosing our office.  ?[facility] [employer] Team at [facility] ? [facility] at [facility] ?([address] [place], [place] [address]) ?Entrance C, located off of [address] ?Free 24/7 valet parking  ? Nausea & Vomiting ?Have saltine crackers or pretzels by your bed and eat a few bites before you raise your head out of bed in the morning ?Eat small frequent meals throughout the day instead of large meals ?Drink plenty of fluids throughout the day to stay hydrated, just don't drink a lot of fluids with your meals.  This can make your stomach fill up faster making you feel sick ?Do not brush your teeth right after you eat ?Products with real ginger are good for nausea, like ginger ale and ginger hard candy Make sure it says made with real ginger! ?Sucking on sour candy like lemon heads is also good for nausea ?If your prenatal vitamins make you nauseated, take them at night so you will sleep through the nausea ?Sea Bands ?If you feel like you need medicine for the nausea & vomiting please let us know ?If you are unable to keep any fluids or food down please let us know ? ? Constipation ?Drink plenty of fluid, preferably water, throughout the day ?Eat foods high in fiber such as fruits, vegetables, and grains ?Exercise, such as walking, is a good way to keep your bowels regular ?Drink warm fluids, especially warm prune juice, or decaf coffee ?Eat a 1/2 cup of real oatmeal (not instant), 1/2 cup applesauce, and 1/2-1 cup warm prune juice every day ?If needed, you may take Colace (docusate sodium) stool  softener once or twice a day to help keep the stool soft.  ?If you still are having problems with constipation, you may take Miralax once daily as needed to help keep your bowels regular.  ? ?Home Blood Pressure Monitoring for Patients  ? ?Your provider has recommended that you check your blood pressure (BP) at least once a week at home. If you do not have a blood pressure cuff at home, one will be provided for you. Contact your provider if you have not received your monitor within 1 week.  ? ?Helpful Tips for Accurate Home Blood Pressure Checks  ?Don't smoke, exercise, or drink caffeine 30 minutes before checking your BP ?Use the restroom before checking your BP (a full bladder can raise your pressure) ?Relax in a comfortable upright chair ?Feet on the ground ?Left arm resting comfortably on a flat surface at the level of your heart ?Legs uncrossed ?Back supported ?Sit quietly and don't talk ?Place the cuff on your bare arm ?Adjust snuggly, so that only two fingertips can fit between your skin and the top of the cuff ?Check 2 readings separated by at least one minute ?Keep a log of your BP readings ?For a visual, please reference this diagram: http://ccnc.care/bpdiagram ? ?Provider Name: [facility]     Phone: [phone] ? ?Zone 1: ALL CLEAR  ?Continue to monitor your symptoms:  ?BP reading is less than 140 (top number) or less than 90 (bottom  number)  ?No right upper stomach pain ?No headaches or seeing spots ?No feeling nauseated or throwing up ?No swelling in face and hands ? ?Zone 2: CAUTION ?Call your doctor's office for any of the following:  ?BP reading is greater than 140 (top number) or greater than 90 (bottom number)  ?Stomach pain under your ribs in the middle or right side ?Headaches or seeing spots ?Feeling nauseated or throwing up ?Swelling in face and hands ? ?Zone 3: EMERGENCY  ?Seek immediate medical care if you have any of the following:  ?BP reading is greater than160 (top number) or  greater than 110 (bottom number) ?Severe headaches not improving with Tylenol ?Serious difficulty catching your breath ?Any worsening symptoms from Zone 2  ? ? ?For Headaches:  ?Stay well hydrated, drink enough water so that your urine is clear, sometimes if you are dehydrated you can get headaches ?Eat small frequent meals and snacks, sometimes if you are hungry you can get headaches ?Sometimes you get headaches during pregnancy from the pregnancy hormones ?You can try tylenol (1-2 regular strength 325mg  or 1-2 extra strength 500mg ) as directed on the box. The least amount of medication that works is best.  ?Cool compresses (cool wet washcloth or ice pack) to area of head that is hurting ?You can also try drinking a caffeinated drink to see if this will help ?If not helping, try below: ? ?For Prevention of Headaches/Migraines: ?CoQ10 100mg  three times daily ?Vitamin B2 400mg  daily ?Magnesium Oxide 400-600mg  daily ? ?Foods to alleviate migraines:  ?1) dark leafy greens ?2) avocado ?3) tuna ?4) salmon  ?5) beans and legumes ? ?Foods to avoid: ?1) Excessive (or irregular timing) coffee ?3) aged cheeses ?4) chocolate ?5) citrus fruits ?6) aspartame and other artifical sweeteners ?7) yeast ?8) MSG (in processed foods) ?9) processed and cured meats ?10) nuts and certain seeds ?11) chicken livers and other organ meats ?12) dairy products like buttermilk, sour cream, and yogurt ?13) dried fruits like dates, figs, and raisins ?14) garlic ?15) onions ?16) potato chips ?17) pickled foods like olives and sauerkraut ?18) some fresh fruits like ripe banana, papaya, red plums, raspberries, kiwi, pineapple ?19) tomato-based products ? ?Recommend to keep a migraine diary: rate daily the severity of your headache (1-10) and what foods you eat that day to help determine patterns.  ? ?If You Get a Bad Headache/Migraine: ?Benadryl 25mg   ?Magnesium Oxide ?1 large Gatorade ?2 extra strength Tylenol (1,000mg  total) ?1 cup coffee or Coke ?    ? ? ?If this doesn't help please call us @ [phone]  ? First Trimester of Pregnancy ?The first trimester of pregnancy is from week 1 until the end of week 12 (months 1 through 3). A week after a sperm fertilizes an egg, the egg will implant on the wall of the uterus. This embryo will begin to develop into a baby. Genes from you and your partner are forming the baby. The female genes determine whether the baby is a boy or a girl. At 6-8 weeks, the eyes and face are formed, and the heartbeat can be seen on ultrasound. At the end of 12 weeks, all the baby's organs are formed.  ?Now that you are pregnant, you will want to do everything you can to have a healthy baby. Two of the most important things are to get good prenatal care and to follow your health care provider's instructions. Prenatal care is all the medical care you receive before the baby's birth. This care  will help prevent, find, and treat any problems during the pregnancy and childbirth. ?BODY CHANGES ?Your body goes through many changes during pregnancy. The changes vary from woman to woman.  ?You may gain or lose a couple of pounds at first. ?You may feel sick to your stomach (nauseous) and throw up (vomit). If the vomiting is uncontrollable, call your health care provider. ?You may tire easily. ?You may develop headaches that can be relieved by medicines approved by your health care provider. ?You may urinate more often. Painful urination may mean you have a bladder infection. ?You may develop heartburn as a result of your pregnancy. ?You may develop constipation because certain hormones are causing the muscles that push waste through your intestines to slow down. ?You may develop hemorrhoids or swollen, bulging veins (varicose veins). ?Your breasts may begin to grow larger and become tender. Your nipples may stick out more, and the tissue that surrounds them (areola) may become darker. ?Your gums may bleed and may be sensitive to brushing and  flossing. ?Dark spots or blotches (chloasma, mask of pregnancy) may develop on your face. This will likely fade after the baby is born. ?Your menstrual periods will stop. ?You may have a loss of appetite. ?You may d

## 2021-08-26 NOTE — Progress Notes (Signed)
? ? ?INITIAL OBSTETRICAL VISIT ?Patient name: Latasha [name] MRN [id number]  Date of birth: February 19, 1991 ?Chief Complaint:   ?Initial Prenatal Visit (Headaches daily) ? ?History of Present Illness:   ?Latasha [name] is a 31 y.o. G32P0010 African-American female at [redacted]w[redacted]d by [place] at 6 weeks with an Estimated Date of Delivery: 03/06/22 being seen today for her initial obstetrical visit.   ?No LMP recorded (lmp unknown). Patient is pregnant. ?Her obstetrical history is significant for  SAB x 1 .   ?Today she reports N/V, requests rx. Headaches ?H/O DM- lost 110lb in 2022 w/ pancreatitis and no longer has DM per pt ?Last pap 10/20/19. Results were: NILM w/ HRHPV positive: other (not 16, 18/45) ? ? ?  08/26/2021  ?  9:32 AM 10/20/2019  ?  8:41 AM 03/27/2017  ? 10:10 AM 03/25/2016  ?  9:25 AM  ?Depression screen PHQ 2/9  ?Decreased Interest 0 0 0 0  ?Down, Depressed, Hopeless 0 1 0 0  ?PHQ - 2 Score 0 1 0 0  ?Altered sleeping 0 1    ?Tired, decreased energy 0 2    ?Change in appetite 0 2    ?Feeling bad or failure about yourself  0 0    ?Trouble concentrating 0 0    ?Moving slowly or fidgety/restless 0 0    ?Suicidal thoughts 0 0    ?PHQ-9 Score 0 6    ?Difficult doing work/chores  Somewhat difficult    ? ?  ? ?  08/26/2021  ?  9:32 AM 10/20/2019  ?  8:42 AM  ?GAD 7 : Generalized Anxiety Score  ?Nervous, Anxious, on Edge 0 0  ?Control/stop worrying 0 0  ?Worry too much - different things 0 1  ?Trouble relaxing 0 1  ?Restless 0 0  ?Easily annoyed or irritable 0 1  ?Afraid - awful might happen 0 0  ?Total GAD 7 Score 0 3  ?Anxiety Difficulty  Not difficult at all  ? ? ? ?Review of Systems:   ?Pertinent items are noted in HPI ?Denies cramping/contractions, leakage of fluid, vaginal bleeding, abnormal vaginal discharge w/ itching/odor/irritation, headaches, visual changes, shortness of breath, chest pain, abdominal pain, severe nausea/vomiting, or problems with urination or bowel movements unless otherwise stated above.  ?Pertinent  History Reviewed:  ?Reviewed past medical,surgical, social, obstetrical and family history.  ?Reviewed problem list, medications and allergies. ?OB History  ?Gravida Para Term Preterm AB Living  ?2       1 0  ?SAB IAB Ectopic Multiple Live Births  ?1          ?  ?# Outcome Date GA Lbr Len/2nd Weight Sex Delivery Anes PTL Lv  ?2 Current           ?1 SAB           ? ?Physical Assessment:  ? ?Vitals:  ? 08/26/21 0832  ?BP: 131/74  ?Pulse: 91  ?Weight: 226 lb (102.5 kg)  ?Body mass index is 36.48 kg/m?. ? ?     Physical Examination: ? General appearance - well appearing, and in no distress ? Mental status - alert, oriented to person, place, and time ? Psych:  She has a normal mood and affect ? Skin - warm and dry, normal color, no suspicious lesions noted ? Chest - effort normal, all lung fields clear to auscultation bilaterally ? Heart - normal rate and regular rhythm ? Abdomen - soft, nontender ? Extremities:  No swelling or varicosities noted ?  Thin prep pap is not done-pt only wants Dr. Despina Hidden to do ? ?Chaperone: N/A   ? ?TODAY'S NT [place] 12+4 wks,measurements c/w dates,normal ovaries,CRL 67.98 mm,NB present,NT 1.6 mm,FHR 154 bpm,posterior placenta  ? ?Results for orders placed or performed in visit on 08/26/21 (from the past 24 hour(s))  ?POC Urinalysis Dipstick OB  ? Collection Time: 08/26/21  9:46 AM  ?Result Value Ref Range  ? Color, UA    ? Clarity, UA    ? Glucose, UA Negative Negative  ? Bilirubin, UA    ? Ketones, UA neg   ? Spec Grav, UA    ? Blood, UA neg   ? pH, UA    ? POC,PROTEIN,UA Trace Negative, Trace, Small (1+), Moderate (2+), Large (3+), 4+  ? Urobilinogen, UA    ? Nitrite, UA neg   ? Leukocytes, UA Negative Negative  ? Appearance    ? Odor    ?  ?Assessment & Plan:  ?1) Low-Risk Pregnancy G2P0010 at 106w4d with an Estimated Date of Delivery: 03/06/22  ? ?2) Initial OB visit ? ?3) H/O DM> lost 110lb w/ pancreatitis and no longer has DM per pt, A1C today ? ?4) H/O pancreatitis ? ?5) N/V> rx diclegis ? ?6)  Due for pap> wants Dr. Despina Hidden to do ? ?7) Headaches> gave printed prevention/relief measures  ? ?Meds:  ?Meds ordered this encounter  ?Medications  ? aspirin 81 MG EC tablet  ?  Sig: Take 1 tablet (81 mg total) by mouth daily. Swallow whole.  ?  Dispense:  90 tablet  ?  Refill:  3  ?  Order Specific Question:   Supervising Provider  ?  Answer:   Duane Lope H [[id number]]  ? ? ?Initial labs obtained ?Continue prenatal vitamins ?Reviewed n/v relief measures and warning s/s to report ?Reviewed recommended weight gain based on pre-gravid BMI ?Encouraged well-balanced diet ?Genetic & carrier screening discussed: requests Panorama, NT/IT, and Horizon  ?Ultrasound discussed; fetal survey: requested ?CCNC completed> form faxed if has or is planning to apply for medicaid ?The nature of [place] [facility] with multiple MDs and other Advanced Practice Providers was explained to patient; also emphasized that fellows, residents, and students are part of our team. ?Does have home bp cuff. Office bp cuff given: no. Rx sent: n/a. Check bp weekly, let us know if consistently >140/90.  ? ?Indications for ASA therapy (per uptodate) ?OR Two or more of the following: ?Nulliparity Yes ?Obesity (BMI>30 kg/m2) Yes ?Sociodemographic characteristics (African American race, low socioeconomic level) Yes ? ? ?Follow-up: Return in about 4 weeks (around 09/23/2021) for LROB w/ pap, in person-Dr. Despina Hidden only, 2nd IT.  ? ?Orders Placed This Encounter  ?Procedures  ? GC/Chlamydia Probe Amp  ? Urine Culture  ? Genetic Screening  ? Integrated 1  ? Hemoglobin A1c  ? CBC/D/Plt+RPR+Rh+ABO+RubIgG...  ? POC Urinalysis Dipstick OB  ? ? ?Cheral Marker CNM, WHNP-BC ?08/26/2021 ?10:31 AM  ?

## 2021-08-26 NOTE — Progress Notes (Signed)
[place] 12+4 wks,measurements c/w dates,normal ovaries,CRL 67.98 mm,NB present,NT 1.6 mm,FHR 154 bpm,posterior placenta  ?

## 2021-08-28 LAB — INTEGRATED 1
Crown Rump Length: 67.9 mm
Gest. Age on Collection Date: 12.9 weeks
Maternal Age at EDD: 32.1 yr
Nuchal Translucency (NT): 1.6 mm
Number of Fetuses: 1
PAPP-A Value: 708.3 ng/mL
Weight: 226 [lb_av]

## 2021-08-28 LAB — CBC/D/PLT+RPR+RH+ABO+RUBIGG...
Antibody Screen: NEGATIVE
Basophils Absolute: 0 10*3/uL (ref 0.0–0.2)
Basos: 0 %
EOS (ABSOLUTE): 0.1 10*3/uL (ref 0.0–0.4)
Eos: 1 %
HCV Ab: NONREACTIVE
HIV Screen 4th Generation wRfx: NONREACTIVE
Hematocrit: 34.4 % (ref 34.0–46.6)
Hemoglobin: 11.1 g/dL (ref 11.1–15.9)
Hepatitis B Surface Ag: NEGATIVE
Immature Grans (Abs): 0 10*3/uL (ref 0.0–0.1)
Immature Granulocytes: 1 %
Lymphocytes Absolute: 1.2 10*3/uL (ref 0.7–3.1)
Lymphs: 15 %
MCH: 25.2 pg — ABNORMAL LOW (ref 26.6–33.0)
MCHC: 32.3 g/dL (ref 31.5–35.7)
MCV: 78 fL — ABNORMAL LOW (ref 79–97)
Monocytes Absolute: 0.4 10*3/uL (ref 0.1–0.9)
Monocytes: 5 %
Neutrophils Absolute: 6.6 10*3/uL (ref 1.4–7.0)
Neutrophils: 78 %
Platelets: 354 10*3/uL (ref 150–450)
RBC: 4.4 x10E6/uL (ref 3.77–5.28)
RDW: 16.3 % — ABNORMAL HIGH (ref 11.7–15.4)
RPR Ser Ql: NONREACTIVE
Rh Factor: POSITIVE
Rubella Antibodies, IGG: <0.9 index — ABNORMAL LOW (ref >0.99)
WBC: 8.4 10*3/uL (ref 3.4–10.8)

## 2021-08-28 LAB — HCV INTERPRETATION

## 2021-08-28 LAB — HEMOGLOBIN A1C
Est. average glucose Bld gHb Est-mCnc: 120 mg/dL
Hgb A1c MFr Bld: 5.8 % — ABNORMAL HIGH (ref 4.8–5.6)

## 2021-08-28 LAB — URINE CULTURE: Organism ID, Bacteria: NO GROWTH

## 2021-08-29 LAB — GC/CHLAMYDIA PROBE AMP
Chlamydia trachomatis, NAA: NEGATIVE
Neisseria Gonorrhoeae by PCR: NEGATIVE

## 2021-08-30 ENCOUNTER — Other Ambulatory Visit: Payer: Medicaid Other

## 2021-08-30 DIAGNOSIS — R7309 Other abnormal glucose: Secondary | ICD-10-CM

## 2021-08-31 LAB — GLUCOSE TOLERANCE, 2 HOURS W/ 1HR
Glucose, 1 hour: 95 mg/dL (ref 70–179)
Glucose, 2 hour: 95 mg/dL (ref 70–152)
Glucose, Fasting: 84 mg/dL (ref 70–91)

## 2021-09-10 ENCOUNTER — Encounter: Payer: Self-pay | Admitting: Women's Health

## 2021-09-10 DIAGNOSIS — D563 Thalassemia minor: Secondary | ICD-10-CM | POA: Insufficient documentation

## 2021-09-26 ENCOUNTER — Ambulatory Visit (INDEPENDENT_AMBULATORY_CARE_PROVIDER_SITE_OTHER): Payer: Medicaid Other | Admitting: Obstetrics & Gynecology

## 2021-09-26 ENCOUNTER — Encounter: Payer: Self-pay | Admitting: Obstetrics & Gynecology

## 2021-09-26 ENCOUNTER — Other Ambulatory Visit (HOSPITAL_COMMUNITY)
Admission: RE | Admit: 2021-09-26 | Discharge: 2021-09-26 | Disposition: A | Discharge status: Home / Self Care | Payer: Medicaid Other | Source: Ambulatory Visit | Attending: Obstetrics & Gynecology | Admitting: Obstetrics & Gynecology

## 2021-09-26 VITALS — BP 131/82 | HR 83 | Wt 226.0 lb

## 2021-09-26 DIAGNOSIS — Z1379 Encounter for other screening for genetic and chromosomal anomalies: Secondary | ICD-10-CM

## 2021-09-26 DIAGNOSIS — Z124 Encounter for screening for malignant neoplasm of cervix: Secondary | ICD-10-CM

## 2021-09-26 DIAGNOSIS — Z3480 Encounter for supervision of other normal pregnancy, unspecified trimester: Secondary | ICD-10-CM

## 2021-09-26 MED ORDER — TERCONAZOLE 0.4 % VA CREA: 1.0000 | TOPICAL_CREAM | Freq: Every day | VAGINAL | 0 refills | Status: DC

## 2021-09-26 NOTE — Addendum Note (Signed)
Addended by: Lazaro Arms on: 09/26/2021 09:09 AM   Modules accepted: Orders

## 2021-09-26 NOTE — Progress Notes (Addendum)
   LOW-RISK PREGNANCY VISIT Patient name: Latasha [name] MRN [id number]  Date of birth: 05-Aug-1990 Chief Complaint:   Gynecologic Exam and Routine Prenatal Visit  History of Present Illness:   Latasha [name] is a 31 y.o. G33P0010 female at [redacted]w[redacted]d with an Estimated Date of Delivery: 03/06/22 being seen today for ongoing management of a low-risk pregnancy.     08/26/2021    9:32 AM 10/20/2019    8:41 AM 03/27/2017   10:10 AM 03/25/2016    9:25 AM  Depression screen PHQ 2/9  Decreased Interest 0 0 0 0  Down, Depressed, Hopeless 0 1 0 0  PHQ - 2 Score 0 1 0 0  Altered sleeping 0 1    Tired, decreased energy 0 2    Change in appetite 0 2    Feeling bad or failure about yourself  0 0    Trouble concentrating 0 0    Moving slowly or fidgety/restless 0 0    Suicidal thoughts 0 0    PHQ-9 Score 0 6    Difficult doing work/chores  Somewhat difficult      Today she reports no complaints. Contractions: Not present. Vag. Bleeding: None.  Movement: Present. denies leaking of fluid. Review of Systems:   Pertinent items are noted in HPI Denies abnormal vaginal discharge w/ itching/odor/irritation, headaches, visual changes, shortness of breath, chest pain, abdominal pain, severe nausea/vomiting, or problems with urination or bowel movements unless otherwise stated above. Pertinent History Reviewed:  Reviewed past medical,surgical, social, obstetrical and family history.  Reviewed problem list, medications and allergies. Physical Assessment:   Vitals:   09/26/21 0848  BP: 131/82  Pulse: 83  Weight: 226 lb (102.5 kg)  Body mass index is 36.48 kg/m.        Physical Examination:   General appearance: Well appearing, and in no distress  Mental status: Alert, oriented to person, place, and time  Skin: Warm & dry  Cardiovascular: Normal heart rate noted  Respiratory: Normal respiratory effort, no distress  Abdomen: Soft, gravid, nontender  Pelvic: Cervical exam performed          Extremities: Edema: Trace  Fetal Status:     Movement: Present    Chaperone:  Therapist, sports     No results found for this or any previous visit (from the past 24 hour(s)).  Assessment & Plan:  1) Low-risk pregnancy G2P0010 at [redacted]w[redacted]d with an Estimated Date of Delivery: 03/06/22      Meds:  Meds ordered this encounter  Medications   terconazole (TERAZOL 7) 0.4 % vaginal cream    Sig: Place 1 applicator vaginally at bedtime.    Dispense:  45 g    Refill:  0   Labs/procedures today: 2nd IT today  Plan:  Continue routine obstetrical care  Next visit: prefers in person    Reviewed: Preterm labor symptoms and general obstetric precautions including but not limited to vaginal bleeding, contractions, leaking of fluid and fetal movement were reviewed in detail with the patient.  All questions were answered. Has home bp cuff. Rx faxed to . Check bp weekly, let us know if >140/90.   Follow-up: Return in about 3 weeks (around 10/17/2021) for 20 week sono, LROB.  Orders Placed This Encounter  Procedures   [facility] [place], MD 09/26/2021 9:08 AM

## 2021-09-28 LAB — INTEGRATED 2
AFP MoM: 0.98
Alpha-Fetoprotein: 29.8 ng/mL
Crown Rump Length: 67.9 mm
DIA MoM: 0.88
DIA Value: 105.1 pg/mL
Estriol, Unconjugated: 0.51 ng/mL
Gest. Age on Collection Date: 12.9 weeks
Gestational Age: 17.3 weeks
Maternal Age at EDD: 32.1 yr
Nuchal Translucency (NT): 1.6 mm
Nuchal Translucency MoM: 1.04
Number of Fetuses: 1
PAPP-A MoM: 1.01
PAPP-A Value: 708.3 ng/mL
Test Results:: NEGATIVE
Weight: 226 [lb_av]
Weight: 226 [lb_av]
hCG MoM: 1.4
hCG Value: 30.3 IU/mL
uE3 MoM: 0.48

## 2021-10-01 LAB — CYTOLOGY - PAP
Comment: NEGATIVE
Diagnosis: NEGATIVE
Diagnosis: REACTIVE
High risk HPV: NEGATIVE

## 2021-10-30 ENCOUNTER — Other Ambulatory Visit: Payer: Self-pay | Admitting: Obstetrics & Gynecology

## 2021-10-30 DIAGNOSIS — Z363 Encounter for antenatal screening for malformations: Secondary | ICD-10-CM

## 2021-10-31 ENCOUNTER — Encounter: Payer: Self-pay | Admitting: Obstetrics & Gynecology

## 2021-10-31 ENCOUNTER — Ambulatory Visit (INDEPENDENT_AMBULATORY_CARE_PROVIDER_SITE_OTHER): Payer: Medicaid Other

## 2021-10-31 ENCOUNTER — Ambulatory Visit (INDEPENDENT_AMBULATORY_CARE_PROVIDER_SITE_OTHER): Payer: Medicaid Other | Admitting: Obstetrics & Gynecology

## 2021-10-31 DIAGNOSIS — Z3480 Encounter for supervision of other normal pregnancy, unspecified trimester: Secondary | ICD-10-CM

## 2021-10-31 DIAGNOSIS — Z363 Encounter for antenatal screening for malformations: Secondary | ICD-10-CM | POA: Diagnosis not present

## 2021-10-31 DIAGNOSIS — Z3A22 22 weeks gestation of pregnancy: Secondary | ICD-10-CM

## 2021-10-31 NOTE — Progress Notes (Signed)
   LOW-RISK PREGNANCY VISIT Patient name: Latasha [name] MRN [id number]  Date of birth: [birth year] Chief Complaint:   Routine Prenatal Visit (Anatomy scan)  History of Present Illness:   Latasha [name] is a 31 y.o. G35P0010 female at [redacted]w[redacted]d with an Estimated Date of Delivery: 03/06/22 being seen today for ongoing management of a low-risk pregnancy.     08/26/2021    9:32 AM 10/20/2019    8:41 AM 03/27/2017   10:10 AM 03/25/2016    9:25 AM  Depression screen PHQ 2/9  Decreased Interest 0 0 0 0  Down, Depressed, Hopeless 0 1 0 0  PHQ - 2 Score 0 1 0 0  Altered sleeping 0 1    Tired, decreased energy 0 2    Change in appetite 0 2    Feeling bad or failure about yourself  0 0    Trouble concentrating 0 0    Moving slowly or fidgety/restless 0 0    Suicidal thoughts 0 0    PHQ-9 Score 0 6    Difficult doing work/chores  Somewhat difficult      Today she reports no complaints. Contractions: Not present. Vag. Bleeding: None.  Movement: Present. denies leaking of fluid. Review of Systems:   Pertinent items are noted in HPI Denies abnormal vaginal discharge w/ itching/odor/irritation, headaches, visual changes, shortness of breath, chest pain, abdominal pain, severe nausea/vomiting, or problems with urination or bowel movements unless otherwise stated above. Pertinent History Reviewed:  Reviewed past medical,surgical, social, obstetrical and family history.  Reviewed problem list, medications and allergies. Physical Assessment:   Vitals:   10/31/21 1209  BP: 120/73  Pulse: 78  Weight: 230 lb (104.3 kg)  Body mass index is 37.12 kg/m.        Physical Examination:   General appearance: Well appearing, and in no distress  Mental status: Alert, oriented to person, place, and time  Skin: Warm & dry  Cardiovascular: Normal heart rate noted  Respiratory: Normal respiratory effort, no distress  Abdomen: Soft, gravid, nontender  Pelvic: Cervical exam deferred         Extremities:  Edema: None  Fetal Status:     Movement: Present    Chaperone: n/a    No results found for this or any previous visit (from the past 24 hour(s)).  Assessment & Plan:  1) Low-risk pregnancy G2P0010 at [redacted]w[redacted]d with an Estimated Date of Delivery: 03/06/22      Meds: No orders of the defined types were placed in this encounter.  Labs/procedures today:   Plan:  Continue routine obstetrical care  Next visit: prefers in person    Reviewed: Preterm labor symptoms and general obstetric precautions including but not limited to vaginal bleeding, contractions, leaking of fluid and fetal movement were reviewed in detail with the patient.  All questions were answered. has home bp cuff. Rx faxed to . Check bp weekly, let us know if >140/90.   Follow-up: Return in about 4 weeks (around 11/28/2021) for PN2, LROB.  No orders of the defined types were placed in this encounter.   Lazaro Arms, MD 10/31/2021 12:28 PM

## 2021-10-31 NOTE — Progress Notes (Signed)
[place] 22 wks,cephalic,posterior placenta gr 0,normal ovaries,CX 2.9 cm,SVP of fluid 4.7 cm,fhr 152 bpm,EFW 528 g 78%,anatomy complete,no obvious abnormalities

## 2021-11-14 ENCOUNTER — Encounter: Payer: Self-pay | Admitting: Obstetrics & Gynecology

## 2021-11-29 ENCOUNTER — Ambulatory Visit (INDEPENDENT_AMBULATORY_CARE_PROVIDER_SITE_OTHER): Payer: Medicaid Other | Admitting: Advanced Practice Midwife

## 2021-11-29 ENCOUNTER — Other Ambulatory Visit: Payer: Medicaid Other

## 2021-11-29 VITALS — BP 134/82 | HR 77 | Wt 231.0 lb

## 2021-11-29 DIAGNOSIS — Z302 Encounter for sterilization: Secondary | ICD-10-CM

## 2021-11-29 DIAGNOSIS — Z3A26 26 weeks gestation of pregnancy: Secondary | ICD-10-CM

## 2021-11-29 DIAGNOSIS — Z3480 Encounter for supervision of other normal pregnancy, unspecified trimester: Secondary | ICD-10-CM

## 2021-11-29 DIAGNOSIS — Z131 Encounter for screening for diabetes mellitus: Secondary | ICD-10-CM

## 2021-11-29 NOTE — Progress Notes (Signed)
   LOW-RISK PREGNANCY VISIT Patient name: Latasha [name] MRN [id number]  Date of birth: 08/24/1990 Chief Complaint:   Routine Prenatal Visit (PN2)  History of Present Illness:   Latasha [name] is a 31 y.o. G56P0010 female at [redacted]w[redacted]d with an Estimated Date of Delivery: 03/06/22 being seen today for ongoing management of a low-risk pregnancy.  Today she reports no complaints. Contractions: Not present. Vag. Bleeding: None.  Movement: Present. denies leaking of fluid. Review of Systems:   Pertinent items are noted in HPI Denies abnormal vaginal discharge w/ itching/odor/irritation, headaches, visual changes, shortness of breath, chest pain, abdominal pain, severe nausea/vomiting, or problems with urination or bowel movements unless otherwise stated above. Pertinent History Reviewed:  Reviewed past medical,surgical, social, obstetrical and family history.  Reviewed problem list, medications and allergies. Physical Assessment:   Vitals:   11/29/21 0914  BP: 134/82  Pulse: 77  Weight: 231 lb (104.8 kg)  Body mass index is 37.28 kg/m.        Physical Examination:   General appearance: Well appearing, and in no distress  Mental status: Alert, oriented to person, place, and time  Skin: Warm & dry  Cardiovascular: Normal heart rate noted  Respiratory: Normal respiratory effort, no distress  Abdomen: Soft, gravid, nontender  Pelvic: Cervical exam deferred         Extremities: Edema: None  Fetal Status: Fetal Heart Rate (bpm): 153 Fundal Height: 28 cm Movement: Present    No results found for this or any previous visit (from the past 24 hour(s)).  Assessment & Plan:  1) Low-risk pregnancy G2P0010 at [redacted]w[redacted]d with an Estimated Date of Delivery: 03/06/22   2) Hx DM, GTT today  3) Desires BTL, sign 30d papers at next visit   Meds: No orders of the defined types were placed in this encounter.  Labs/procedures today: PN2  Plan:  Continue routine obstetrical care   Reviewed: Preterm  labor symptoms and general obstetric precautions including but not limited to vaginal bleeding, contractions, leaking of fluid and fetal movement were reviewed in detail with the patient.  All questions were answered. Has home bp cuff. Check bp weekly, let us know if >140/90.   Follow-up: Return in about 4 weeks (around 12/27/2021) for LROB, in person.  No orders of the defined types were placed in this encounter.  Arabella Merles CNM 11/29/2021 9:28 AM

## 2021-11-29 NOTE — Patient Instructions (Signed)
Latasha [name], thank you for choosing our office today! We appreciate the opportunity to meet your healthcare needs. You may receive a short survey by mail, e-mail, or through [employer]. If you are happy with your care we would appreciate if you could take just a few minutes to complete the survey questions. We read all of your comments and take your feedback very seriously. Thank you again for choosing our office.  Center for [employer] Team at [facility] at [facility] ([address] [place], [place] [address]) Entrance C, located off of [address] Free 24/7 valet parking  Go to [email or web address] to register for FREE online childbirth classes  Call the office [phone]) or go to [facility] if: You begin to severe cramping Your water breaks.  Sometimes it is a big gush of fluid, sometimes it is just a trickle that keeps getting your panties wet or running down your legs You have vaginal bleeding.  It is normal to have a small amount of spotting if your cervix was checked.   [facility] Pediatricians/Family Doctors [facility]): [address] Colette Ribas, [phone]           [facility]: [address], [phone]                [facility]): [address], [phone] (call to ask if accepting patients) [facility] Department: [address], [place], [phone]    [facility] Pediatricians/Family Doctors [facility]): [phone], [phone] [facility]: [address] [place], [phone] [facility]: [address], [phone]  [facility] Doctors  [facility]): [phone] [facility]: [address] [phone]   [facility] Doctors [facility]: [address], [phone]  [facility] Doctors  [facility]: [phone], [phone]  Home Blood Pressure Monitoring for Patients   Your provider has recommended that you check your blood pressure (BP) at least once a week at home. If you do not have a blood pressure cuff at home, one will be provided for you. Contact your provider if you have not received your monitor within 1 week.   Helpful Tips for Accurate Home Blood Pressure Checks  Don't smoke, exercise, or drink caffeine 30 minutes before checking your BP Use the restroom before checking your BP (a full bladder can raise your pressure) Relax in a comfortable upright chair Feet on the ground Left arm resting comfortably on a flat surface at the level of your heart Legs uncrossed Back supported Sit quietly and don't talk Place the cuff on your bare arm Adjust snuggly, so that only two fingertips can fit between your skin and the top of the cuff Check 2 readings separated by at least one minute Keep a log of your BP readings For a visual, please reference this diagram: http://ccnc.care/bpdiagram  Provider Name: [facility]     Phone: [phone]  Zone 1: ALL CLEAR  Continue to monitor your symptoms:  BP reading is less than 140 (top number) or less than 90 (bottom number)  No right upper stomach pain No headaches or seeing spots No feeling nauseated or throwing up No swelling in face and hands  Zone 2: CAUTION Call your doctor's office for any of the following:  BP reading is greater than 140 (top number) or greater than  90 (bottom number)  Stomach pain under your ribs in the middle or right side Headaches or seeing spots Feeling nauseated or throwing up Swelling in face and hands  Zone 3: EMERGENCY  Seek immediate medical care if you have any of the following:  BP reading is greater than160 (top number) or greater than 110 (bottom number) Severe headaches not improving with Tylenol Serious difficulty catching your breath Any worsening symptoms from  Zone 2     Second Trimester of Pregnancy The second trimester is from week 14 through week 27 (months 4 through 6). The second trimester is often a time when you feel your best. Your body has adjusted to being pregnant, and you begin to feel better physically. Usually, morning sickness has lessened or quit completely, you may have more energy, and you may have an increase in appetite. The second trimester is also a time when the fetus is growing rapidly. At the end of the sixth month, the fetus is about 9 inches long and weighs about 1 pounds. You will likely begin to feel the baby move (quickening) between 16 and 20 weeks of pregnancy. Body changes during your second trimester Your body continues to go through many changes during your second trimester. The changes vary from woman to woman. Your weight will continue to increase. You will notice your lower abdomen bulging out. You may begin to get stretch marks on your hips, abdomen, and breasts. You may develop headaches that can be relieved by medicines. The medicines should be approved by your health care provider. You may urinate more often because the fetus is pressing on your bladder. You may develop or continue to have heartburn as a result of your pregnancy. You may develop constipation because certain hormones are causing the muscles that push waste through your intestines to slow down. You may develop hemorrhoids or swollen, bulging veins (varicose veins). You may have back pain. This is caused by: Weight gain. Pregnancy hormones that are relaxing the joints in your pelvis. A shift in weight and the muscles that support your balance. Your breasts will continue to grow and they will continue to become tender. Your gums may bleed and may be sensitive to brushing and flossing. Dark spots or blotches (chloasma, mask of pregnancy) may develop on your face. This will likely fade after the baby is born. A dark line from your belly button to  the pubic area (linea nigra) may appear. This will likely fade after the baby is born. You may have changes in your hair. These can include thickening of your hair, rapid growth, and changes in texture. Some women also have hair loss during or after pregnancy, or hair that feels dry or thin. Your hair will most likely return to normal after your baby is born.  What to expect at prenatal visits During a routine prenatal visit: You will be weighed to make sure you and the fetus are growing normally. Your blood pressure will be taken. Your abdomen will be measured to track your baby's growth. The fetal heartbeat will be listened to. Any test results from the previous visit will be discussed.  Your health care provider may ask you: How you are feeling. If you are feeling the baby move. If you have had any abnormal symptoms, such as leaking fluid, bleeding, severe headaches, or abdominal cramping. If you are using any tobacco products, including cigarettes, chewing tobacco, and electronic cigarettes. If you have any questions.  Other tests that may be performed during   your second trimester include: Blood tests that check for: Low iron levels (anemia). High blood sugar that affects pregnant women (gestational diabetes) between 24 and 28 weeks. Rh antibodies. This is to check for a protein on red blood cells (Rh factor). Urine tests to check for infections, diabetes, or protein in the urine. An ultrasound to confirm the proper growth and development of the baby. An amniocentesis to check for possible genetic problems. Fetal screens for spina bifida and Down syndrome. HIV (human immunodeficiency virus) testing. Routine prenatal testing includes screening for HIV, unless you choose not to have this test.  Follow these instructions at home: Medicines Follow your health care provider's instructions regarding medicine use. Specific medicines may be either safe or unsafe to take during  pregnancy. Take a prenatal vitamin that contains at least 600 micrograms (mcg) of folic acid. If you develop constipation, try taking a stool softener if your health care provider approves. Eating and drinking Eat a balanced diet that includes fresh fruits and vegetables, whole grains, good sources of protein such as meat, eggs, or tofu, and low-fat dairy. Your health care provider will help you determine the amount of weight gain that is right for you. Avoid raw meat and uncooked cheese. These carry germs that can cause birth defects in the baby. If you have low calcium intake from food, talk to your health care provider about whether you should take a daily calcium supplement. Limit foods that are high in fat and processed sugars, such as fried and sweet foods. To prevent constipation: Drink enough fluid to keep your urine clear or pale yellow. Eat foods that are high in fiber, such as fresh fruits and vegetables, whole grains, and beans. Activity Exercise only as directed by your health care provider. Most women can continue their usual exercise routine during pregnancy. Try to exercise for 30 minutes at least 5 days a week. Stop exercising if you experience uterine contractions. Avoid heavy lifting, wear low heel shoes, and practice good posture. A sexual relationship may be continued unless your health care provider directs you otherwise. Relieving pain and discomfort Wear a good support bra to prevent discomfort from breast tenderness. Take warm sitz baths to soothe any pain or discomfort caused by hemorrhoids. Use hemorrhoid cream if your health care provider approves. Rest with your legs elevated if you have leg cramps or low back pain. If you develop varicose veins, wear support hose. Elevate your feet for 15 minutes, 3-4 times a day. Limit salt in your diet. Prenatal Care Write down your questions. Take them to your prenatal visits. Keep all your prenatal visits as told by your health  care provider. This is important. Safety Wear your seat belt at all times when driving. Make a list of emergency phone numbers, including numbers for family, friends, the hospital, and police and fire departments. General instructions Ask your health care provider for a referral to a local prenatal education class. Begin classes no later than the beginning of month 6 of your pregnancy. Ask for help if you have counseling or nutritional needs during pregnancy. Your health care provider can offer advice or refer you to specialists for help with various needs. Do not use hot tubs, steam rooms, or saunas. Do not douche or use tampons or scented sanitary pads. Do not cross your legs for long periods of time. Avoid cat litter boxes and soil used by cats. These carry germs that can cause birth defects in the baby and possibly loss of the   fetus by miscarriage or stillbirth. Avoid all smoking, herbs, alcohol, and unprescribed drugs. Chemicals in these products can affect the formation and growth of the baby. Do not use any products that contain nicotine or tobacco, such as cigarettes and e-cigarettes. If you need help quitting, ask your health care provider. Visit your dentist if you have not gone yet during your pregnancy. Use a soft toothbrush to brush your teeth and be gentle when you floss. Contact a health care provider if: You have dizziness. You have mild pelvic cramps, pelvic pressure, or nagging pain in the abdominal area. You have persistent nausea, vomiting, or diarrhea. You have a bad smelling vaginal discharge. You have pain when you urinate. Get help right away if: You have a fever. You are leaking fluid from your vagina. You have spotting or bleeding from your vagina. You have severe abdominal cramping or pain. You have rapid weight gain or weight loss. You have shortness of breath with chest pain. You notice sudden or extreme swelling of your face, hands, ankles, feet, or legs. You  have not felt your baby move in over an hour. You have severe headaches that do not go away when you take medicine. You have vision changes. Summary The second trimester is from week 14 through week 27 (months 4 through 6). It is also a time when the fetus is growing rapidly. Your body goes through many changes during pregnancy. The changes vary from woman to woman. Avoid all smoking, herbs, alcohol, and unprescribed drugs. These chemicals affect the formation and growth your baby. Do not use any tobacco products, such as cigarettes, chewing tobacco, and e-cigarettes. If you need help quitting, ask your health care provider. Contact your health care provider if you have any questions. Keep all prenatal visits as told by your health care provider. This is important. This information is not intended to replace advice given to you by your health care provider. Make sure you discuss any questions you have with your health care provider. Document Released: 04/08/2001 Document Revised: 09/20/2015 Document Reviewed: 06/15/2012 Elsevier Interactive Patient Education  2017 [employer]  

## 2021-11-30 LAB — CBC
Hematocrit: 29.9 % — ABNORMAL LOW (ref 34.0–46.6)
Hemoglobin: 9.7 g/dL — ABNORMAL LOW (ref 11.1–15.9)
MCH: 27.1 pg (ref 26.6–33.0)
MCHC: 32.4 g/dL (ref 31.5–35.7)
MCV: 84 fL (ref 79–97)
Platelets: 322 10*3/uL (ref 150–450)
RBC: 3.58 x10E6/uL — ABNORMAL LOW (ref 3.77–5.28)
RDW: 13.2 % (ref 11.7–15.4)
WBC: 9.5 10*3/uL (ref 3.4–10.8)

## 2021-11-30 LAB — HIV ANTIBODY (ROUTINE TESTING W REFLEX): HIV Screen 4th Generation wRfx: NONREACTIVE

## 2021-11-30 LAB — GLUCOSE TOLERANCE, 2 HOURS W/ 1HR
Glucose, 1 hour: 155 mg/dL (ref 70–179)
Glucose, 2 hour: 71 mg/dL (ref 70–152)
Glucose, Fasting: 80 mg/dL (ref 70–91)

## 2021-11-30 LAB — ANTIBODY SCREEN: Antibody Screen: NEGATIVE

## 2021-11-30 LAB — RPR: RPR Ser Ql: NONREACTIVE

## 2021-12-01 ENCOUNTER — Other Ambulatory Visit: Payer: Self-pay | Admitting: Advanced Practice Midwife

## 2021-12-01 DIAGNOSIS — Z8639 Personal history of other endocrine, nutritional and metabolic disease: Secondary | ICD-10-CM

## 2021-12-01 DIAGNOSIS — O99012 Anemia complicating pregnancy, second trimester: Secondary | ICD-10-CM

## 2021-12-01 DIAGNOSIS — O99019 Anemia complicating pregnancy, unspecified trimester: Secondary | ICD-10-CM | POA: Insufficient documentation

## 2021-12-01 MED ORDER — POLYSACCHARIDE IRON COMPLEX 150 MG PO CAPS: 150.0000 mg | ORAL_CAPSULE | ORAL | 3 refills | Status: DC

## 2021-12-27 ENCOUNTER — Ambulatory Visit (INDEPENDENT_AMBULATORY_CARE_PROVIDER_SITE_OTHER): Payer: Medicaid Other | Admitting: Advanced Practice Midwife

## 2021-12-27 ENCOUNTER — Encounter: Payer: Self-pay | Admitting: Advanced Practice Midwife

## 2021-12-27 VITALS — BP 122/76 | HR 83 | Wt 231.0 lb

## 2021-12-27 DIAGNOSIS — K5900 Constipation, unspecified: Secondary | ICD-10-CM

## 2021-12-27 DIAGNOSIS — Z3A30 30 weeks gestation of pregnancy: Secondary | ICD-10-CM | POA: Diagnosis not present

## 2021-12-27 DIAGNOSIS — Z23 Encounter for immunization: Secondary | ICD-10-CM | POA: Diagnosis not present

## 2021-12-27 DIAGNOSIS — Z3480 Encounter for supervision of other normal pregnancy, unspecified trimester: Secondary | ICD-10-CM

## 2021-12-27 MED ORDER — DOCUSATE SODIUM 100 MG PO CAPS: 100.0000 mg | ORAL_CAPSULE | Freq: Two times a day (BID) | ORAL | 3 refills | Status: DC

## 2021-12-27 NOTE — Progress Notes (Signed)
   LOW-RISK PREGNANCY VISIT Patient name: Latasha [name] MRN [id number]  Date of birth: Sep 12, 1990 Chief Complaint:   Routine Prenatal Visit  History of Present Illness:   Latasha [name] is a 31 y.o. G22P0010 female at [redacted]w[redacted]d with an Estimated Date of Delivery: 03/06/22 being seen today for ongoing management of a low-risk pregnancy.  Today she reports  some constipation since taking Fe . Contractions: Not present. Vag. Bleeding: None.  Movement: Present. denies leaking of fluid. Review of Systems:   Pertinent items are noted in HPI Denies abnormal vaginal discharge w/ itching/odor/irritation, headaches, visual changes, shortness of breath, chest pain, abdominal pain, severe nausea/vomiting, or problems with urination or bowel movements unless otherwise stated above. Pertinent History Reviewed:  Reviewed past medical,surgical, social, obstetrical and family history.  Reviewed problem list, medications and allergies. Physical Assessment:   Vitals:   12/27/21 0840  BP: 122/76  Pulse: 83  Weight: 231 lb (104.8 kg)  Body mass index is 37.28 kg/m.        Physical Examination:   [name] appearance: Well appearing, and in no distress  Mental status: Alert, oriented to person, place, and time  Skin: Warm & dry  Cardiovascular: Normal heart rate noted  Respiratory: Normal respiratory effort, no distress  Abdomen: Soft, gravid, nontender  Pelvic: Cervical exam deferred         Extremities: Edema: None  Fetal Status: Fetal Heart Rate (bpm): 133 Fundal Height: 30 cm Movement: Present    No results found for this or any previous visit (from the past 24 hour(s)).  Assessment & Plan:  1) Low-risk pregnancy G2P0010 at [redacted]w[redacted]d with an Estimated Date of Delivery: 03/06/22   2) Constipation, rx stool softener, increase fluids   Meds:  Meds ordered this encounter  Medications   docusate sodium (PHILLIPS STOOL SOFTENER) 100 MG capsule    Sig: Take 1 capsule (100 mg total) by mouth 2 (two)  times daily.    Dispense:  60 capsule    Refill:  3    Order Specific Question:   Supervising Provider    Answer:   Myna Hidalgo [[id number]]   Labs/procedures today: Tdap  Plan:  Continue routine obstetrical care   Reviewed: Preterm labor symptoms and [name] obstetric precautions including but not limited to vaginal bleeding, contractions, leaking of fluid and fetal movement were reviewed in detail with the patient.  All questions were answered. Has home bp cuff. Check bp weekly, let us know if >140/90.   Follow-up: Return in about 2 weeks (around 01/10/2022) for LROB, in person.  Orders Placed This Encounter  Procedures   Tdap vaccine greater than or equal to 7yo IM   Arabella Merles [facility] 12/27/2021 9:02 AM

## 2021-12-27 NOTE — Patient Instructions (Signed)
Itzy, thank you for choosing our office today! We appreciate the opportunity to meet your healthcare needs. You may receive a short survey by mail, e-mail, or through [employer]. If you are happy with your care we would appreciate if you could take just a few minutes to complete the survey questions. We read all of your comments and take your feedback very seriously. Thank you again for choosing our office.  Center for [employer] Team at [place] at [facility] ([place], [place] [address]) Entrance C, located off of [place] parking   CLASSES: Go to [email or web address] to register for classes (childbirth, breastfeeding, waterbirth, infant CPR, daddy bootcamp, etc.)  Call the office [phone]) or go to [facility] if: You begin to have strong, frequent contractions Your water breaks.  Sometimes it is a big gush of fluid, sometimes it is just a trickle that keeps getting your panties wet or running down your legs You have vaginal bleeding.  It is normal to have a small amount of spotting if your cervix was checked.  You don't feel your baby moving like normal.  If you don't, get you something to eat and drink and lay down and focus on feeling your baby move.   If your baby is still not moving like normal, you should call the office or go to [facility].  Call the office [phone]) or go to [facility] hospital for these signs of pre-eclampsia: Severe headache that does not go away with Tylenol Visual changes- seeing spots, double, blurred vision Pain under your right breast or upper abdomen that does not go away with Tums or heartburn medicine Nausea and/or vomiting Severe swelling in your hands, feet, and face   Tdap Vaccine It is recommended that you get the Tdap vaccine during the third trimester of EACH pregnancy to help protect your baby from getting pertussis (whooping cough) 27-36 weeks is the BEST time to do  this so that you can pass the protection on to your baby. During pregnancy is better than after pregnancy, but if you are unable to get it during pregnancy it will be offered at the hospital.  You can get this vaccine with [place], at the health department, your family doctor, or some local pharmacies Everyone who will be around your baby should also be up-to-date on their vaccines before the baby comes. Adults (who are not pregnant) only need 1 dose of Tdap during adulthood.   [facility] Pediatricians/Family Doctors [facility]): [address] [place], [facility]: [address] [place], [phone]                [facility]): [place], [phone] (call to ask if accepting patients) [facility] Department: [address], [place], [place] Pediatricians/Family Doctors [facility]): [place]. [place], [address], [place] [facility]: [address] [place], [place] [facility] [place]: [place], [place] [facility]): [phone] [facility]: [address], [place]: [address], [place] [facility]: [phone], [phone]  Home Blood Pressure Monitoring for Patients   Your provider has recommended that you check your  blood pressure (BP) at least once a week at home. If you do not have a blood pressure cuff at home, one will be provided for you. Contact your provider if you have not received your monitor within 1 week.   Helpful Tips for Accurate Home Blood Pressure Checks  Don't smoke, exercise, or drink caffeine 30 minutes before checking your BP Use the restroom before checking your BP (a full bladder can raise your  pressure) Relax in a comfortable upright chair Feet on the ground Left arm resting comfortably on a flat surface at the level of your heart Legs uncrossed Back supported Sit quietly and don't talk Place the cuff on your bare arm Adjust snuggly, so that only two fingertips can fit between your skin and the top of the cuff Check 2 readings separated by at least one minute Keep a log of your BP readings For a visual, please reference this diagram: http://ccnc.care/bpdiagram  Provider Name: [facility]     Phone: [phone]  Zone 1: ALL CLEAR  Continue to monitor your symptoms:  BP reading is less than 140 (top number) or less than 90 (bottom number)  No right upper stomach pain No headaches or seeing spots No feeling nauseated or throwing up No swelling in face and hands  Zone 2: CAUTION Call your doctor's office for any of the following:  BP reading is greater than 140 (top number) or greater than 90 (bottom number)  Stomach pain under your ribs in the middle or right side Headaches or seeing spots Feeling nauseated or throwing up Swelling in face and hands  Zone 3: EMERGENCY  Seek immediate medical care if you have any of the following:  BP reading is greater than160 (top number) or greater than 110 (bottom number) Severe headaches not improving with Tylenol Serious difficulty catching your breath Any worsening symptoms from Zone 2   Third Trimester of Pregnancy The third trimester is from week 29 through week 42, months 7 through 9. The third trimester is a time when the fetus is growing rapidly. At the end of the ninth month, the fetus is about 20 inches in length and weighs 6-10 pounds.  BODY CHANGES Your body goes through many changes during pregnancy. The changes vary from woman to woman.  Your weight will continue to increase. You can expect to gain 25-35 pounds (11-16 kg) by the end of the pregnancy. You may begin to get stretch marks on your hips, abdomen,  and breasts. You may urinate more often because the fetus is moving lower into your pelvis and pressing on your bladder. You may develop or continue to have heartburn as a result of your pregnancy. You may develop constipation because certain hormones are causing the muscles that push waste through your intestines to slow down. You may develop hemorrhoids or swollen, bulging veins (varicose veins). You may have pelvic pain because of the weight gain and pregnancy hormones relaxing your joints between the bones in your pelvis. Backaches may result from overexertion of the muscles supporting your posture. You may have changes in your hair. These can include thickening of your hair, rapid growth, and changes in texture. Some women also have hair loss during or after pregnancy, or hair that feels dry or thin. Your hair will most likely return to normal after your baby is born. Your breasts will continue to grow and be tender. A yellow discharge may leak from your breasts called colostrum. Your belly button may stick out. You may   feel short of breath because of your expanding uterus. You may notice the fetus "dropping," or moving lower in your abdomen. You may have a bloody mucus discharge. This usually occurs a few days to a week before labor begins. Your cervix becomes thin and soft (effaced) near your due date. WHAT TO EXPECT AT YOUR PRENATAL EXAMS  You will have prenatal exams every 2 weeks until week 36. Then, you will have weekly prenatal exams. During a routine prenatal visit: You will be weighed to make sure you and the fetus are growing normally. Your blood pressure is taken. Your abdomen will be measured to track your baby's growth. The fetal heartbeat will be listened to. Any test results from the previous visit will be discussed. You may have a cervical check near your due date to see if you have effaced. At around 36 weeks, your caregiver will check your cervix. At the same time, your  caregiver will also perform a test on the secretions of the vaginal tissue. This test is to determine if a type of bacteria, Group B streptococcus, is present. Your caregiver will explain this further. Your caregiver may ask you: What your birth plan is. How you are feeling. If you are feeling the baby move. If you have had any abnormal symptoms, such as leaking fluid, bleeding, severe headaches, or abdominal cramping. If you have any questions. Other tests or screenings that may be performed during your third trimester include: Blood tests that check for low iron levels (anemia). Fetal testing to check the health, activity level, and growth of the fetus. Testing is done if you have certain medical conditions or if there are problems during the pregnancy. FALSE LABOR You may feel small, irregular contractions that eventually go away. These are called Braxton Hicks contractions, or false labor. Contractions may last for hours, days, or even weeks before true labor sets in. If contractions come at regular intervals, intensify, or become painful, it is best to be seen by your caregiver.  SIGNS OF LABOR  Menstrual-like cramps. Contractions that are 5 minutes apart or less. Contractions that start on the top of the uterus and spread down to the lower abdomen and back. A sense of increased pelvic pressure or back pain. A watery or bloody mucus discharge that comes from the vagina. If you have any of these signs before the 37th week of pregnancy, call your caregiver right away. You need to go to the hospital to get checked immediately. HOME CARE INSTRUCTIONS  Avoid all smoking, herbs, alcohol, and unprescribed drugs. These chemicals affect the formation and growth of the baby. Follow your caregiver's instructions regarding medicine use. There are medicines that are either safe or unsafe to take during pregnancy. Exercise only as directed by your caregiver. Experiencing uterine cramps is a good sign to  stop exercising. Continue to eat regular, healthy meals. Wear a good support bra for breast tenderness. Do not use hot tubs, steam rooms, or saunas. Wear your seat belt at all times when driving. Avoid raw meat, uncooked cheese, cat litter boxes, and soil used by cats. These carry germs that can cause birth defects in the baby. Take your prenatal vitamins. Try taking a stool softener (if your caregiver approves) if you develop constipation. Eat more high-fiber foods, such as fresh vegetables or fruit and whole grains. Drink plenty of fluids to keep your urine clear or pale yellow. Take warm sitz baths to soothe any pain or discomfort caused by hemorrhoids. Use hemorrhoid cream if   your caregiver approves. If you develop varicose veins, wear support hose. Elevate your feet for 15 minutes, 3-4 times a day. Limit salt in your diet. Avoid heavy lifting, wear low heal shoes, and practice good posture. Rest a lot with your legs elevated if you have leg cramps or low back pain. Visit your dentist if you have not gone during your pregnancy. Use a soft toothbrush to brush your teeth and be gentle when you floss. A sexual relationship may be continued unless your caregiver directs you otherwise. Do not travel far distances unless it is absolutely necessary and only with the approval of your caregiver. Take prenatal classes to understand, practice, and ask questions about the labor and delivery. Make a trial run to the hospital. Pack your hospital bag. Prepare the baby's nursery. Continue to go to all your prenatal visits as directed by your caregiver. SEEK MEDICAL CARE IF: You are unsure if you are in labor or if your water has broken. You have dizziness. You have mild pelvic cramps, pelvic pressure, or nagging pain in your abdominal area. You have persistent nausea, vomiting, or diarrhea. You have a bad smelling vaginal discharge. You have pain with urination. SEEK IMMEDIATE MEDICAL CARE IF:  You  have a fever. You are leaking fluid from your vagina. You have spotting or bleeding from your vagina. You have severe abdominal cramping or pain. You have rapid weight loss or gain. You have shortness of breath with chest pain. You notice sudden or extreme swelling of your face, hands, ankles, feet, or legs. You have not felt your baby move in over an hour. You have severe headaches that do not go away with medicine. You have vision changes. Document Released: 04/08/2001 Document Revised: 04/19/2013 Document Reviewed: 06/15/2012 [facility] Patient Information 2015 [place], [place]. This information is not intended to replace advice given to you by your health care provider. Make sure you discuss any questions you have with your health care provider.

## 2022-01-10 ENCOUNTER — Encounter: Payer: Medicaid Other | Admitting: Advanced Practice Midwife

## 2022-01-13 ENCOUNTER — Ambulatory Visit (INDEPENDENT_AMBULATORY_CARE_PROVIDER_SITE_OTHER): Payer: Medicaid Other | Admitting: Advanced Practice Midwife

## 2022-01-13 ENCOUNTER — Encounter: Payer: Self-pay | Admitting: Advanced Practice Midwife

## 2022-01-13 VITALS — BP 127/86 | HR 85 | Wt 228.0 lb

## 2022-01-13 DIAGNOSIS — Z3483 Encounter for supervision of other normal pregnancy, third trimester: Secondary | ICD-10-CM

## 2022-01-13 DIAGNOSIS — Z3A32 32 weeks gestation of pregnancy: Secondary | ICD-10-CM

## 2022-01-13 NOTE — Progress Notes (Signed)
   LOW-RISK PREGNANCY VISIT Patient name: Latasha [name] MRN [id number]  Date of birth: [birth year] Chief Complaint:   Routine Prenatal Visit  History of Present Illness:   Latasha [name] is a 31 y.o. G22P0010 female at [redacted]w[redacted]d with an Estimated Date of Delivery: 03/06/22 being seen today for ongoing management of a low-risk pregnancy.  Today she reports no complaints. Contractions: Not present. Vag. Bleeding: None.  Movement: Present. denies leaking of fluid. Review of Systems:   Pertinent items are noted in HPI Denies abnormal vaginal discharge w/ itching/odor/irritation, headaches, visual changes, shortness of breath, chest pain, abdominal pain, severe nausea/vomiting, or problems with urination or bowel movements unless otherwise stated above. Pertinent History Reviewed:  Reviewed past medical,surgical, social, obstetrical and family history.  Reviewed problem list, medications and allergies. Physical Assessment:   Vitals:   01/13/22 0934  BP: 127/86  Pulse: 85  Weight: 228 lb (103.4 kg)  Body mass index is 36.8 kg/m.        Physical Examination:   General appearance: Well appearing, and in no distress  Mental status: Alert, oriented to person, place, and time  Skin: Warm & dry  Cardiovascular: Normal heart rate noted  Respiratory: Normal respiratory effort, no distress  Abdomen: Soft, gravid, nontender  Pelvic: Cervical exam deferred         Extremities: Edema: None  Fetal Status: Fetal Heart Rate (bpm): 140 Fundal Height: 33 cm Movement: Present    Chaperone: n/a    No results found for this or any previous visit (from the past 24 hour(s)).  Assessment & Plan:  1) Low-risk pregnancy G2P0010 at [redacted]w[redacted]d with an Estimated Date of Delivery: 03/06/22     Meds: No orders of the defined types were placed in this encounter.  Labs/procedures today:   Plan:  Continue routine obstetrical care  Next visit: prefers in person    Reviewed: Preterm labor symptoms and general  obstetric precautions including but not limited to vaginal bleeding, contractions, leaking of fluid and fetal movement were reviewed in detail with the patient.  All questions were answered. Has home bp cuff. Check bp weekly, let us know if >140/90.   Follow-up: Return in about 2 weeks (around 01/27/2022) for [place].  No future appointments.  No orders of the defined types were placed in this encounter.  Christin Fudge DNP, CNM 01/13/2022 9:58 AM

## 2022-01-13 NOTE — Patient Instructions (Signed)
Latasha [name], I greatly value your feedback.  If you receive a survey following your visit with [place] today, we appreciate you taking the time to fill it out.  Thanks, Latasha Berthold, DNP, CNM  [place]!!! It is now [place] [facility] ([place], [place] [address]) Entrance located off of [place] parking   Go to [email or web address] to register for FREE online childbirth classes    Call the office [phone]) or go to [place] if: You begin to have strong, frequent contractions Your water breaks.  Sometimes it is a big gush of fluid, sometimes it is just a trickle that keeps getting your panties wet or running down your legs You have vaginal bleeding.  It is normal to have a small amount of spotting if your cervix was checked.  You don't feel your baby moving like normal.  If you don't, get you something to eat and drink and lay down and focus on feeling your baby move.  You should feel at least 10 movements in 2 hours.  If you don't, you should call the office or go to [place] Blood Pressure Monitoring for Patients   Your provider has recommended that you check your blood pressure (BP) at least once a week at home. If you do not have a blood pressure cuff at home, one will be provided for you. Contact your provider if you have not received your monitor within 1 week.   Helpful Tips for Accurate Home Blood Pressure Checks  Don't smoke, exercise, or drink caffeine 30 minutes before checking your BP Use the restroom before checking your BP (a full bladder can raise your pressure) Relax in a comfortable upright chair Feet on the ground Left arm resting comfortably on a flat surface at the level of your heart Legs uncrossed Back supported Sit quietly and don't talk Place the cuff on your bare arm Adjust snuggly, so that only two fingertips can fit between your skin and the  top of the cuff Check 2 readings separated by at least one minute Keep a log of your BP readings For a visual, please reference this diagram: http://ccnc.care/bpdiagram  Provider Name: [facility]     Phone: [phone]  Zone 1: ALL CLEAR  Continue to monitor your symptoms:  BP reading is less than 140 (top number) or less than 90 (bottom number)  No right upper stomach pain No headaches or seeing spots No feeling nauseated or throwing up No swelling in face and hands  Zone 2: CAUTION Call your doctor's office for any of the following:  BP reading is greater than 140 (top number) or greater than 90 (bottom number)  Stomach pain under your ribs in the middle or right side Headaches or seeing spots Feeling nauseated or throwing up Swelling in face and hands  Zone 3: EMERGENCY  Seek immediate medical care if you have any of the following:  BP reading is greater than160 (top number) or greater than 110 (bottom number) Severe headaches not improving with Tylenol Serious difficulty catching your breath Any worsening symptoms from Zone 2

## 2022-01-27 ENCOUNTER — Encounter: Payer: Self-pay | Admitting: Women's Health

## 2022-01-27 ENCOUNTER — Ambulatory Visit (INDEPENDENT_AMBULATORY_CARE_PROVIDER_SITE_OTHER): Payer: Medicaid Other | Admitting: Women's Health

## 2022-01-27 VITALS — BP 122/75 | HR 71 | Wt 224.0 lb

## 2022-01-27 DIAGNOSIS — Z3480 Encounter for supervision of other normal pregnancy, unspecified trimester: Secondary | ICD-10-CM

## 2022-01-27 DIAGNOSIS — Z3483 Encounter for supervision of other normal pregnancy, third trimester: Secondary | ICD-10-CM

## 2022-01-27 NOTE — Patient Instructions (Addendum)
Latasha [name], thank you for choosing our office today! We appreciate the opportunity to meet your healthcare needs. You may receive a short survey by mail, e-mail, or through [employer]. If you are happy with your care we would appreciate if you could take just a few minutes to complete the survey questions. We read all of your comments and take your feedback very seriously. Thank you again for choosing our office.  Center for [employer] Team at [place] at [facility] ([place], [place] [address]) Entrance C, located off of [place] parking   CLASSES: Go to [email or web address] to register for classes (childbirth, breastfeeding, waterbirth, infant CPR, daddy bootcamp, etc.)  Call the office [phone]) or go to [facility] if: You begin to have strong, frequent contractions Your water breaks.  Sometimes it is a big gush of fluid, sometimes it is just a trickle that keeps getting your panties wet or running down your legs You have vaginal bleeding.  It is normal to have a small amount of spotting if your cervix was checked.  You don't feel your baby moving like normal.  If you don't, get you something to eat and drink and lay down and focus on feeling your baby move.   If your baby is still not moving like normal, you should call the office or go to [facility].  Call the office [phone]) or go to [facility] hospital for these signs of pre-eclampsia: Severe headache that does not go away with Tylenol Visual changes- seeing spots, double, blurred vision Pain under your right breast or upper abdomen that does not go away with Tums or heartburn medicine Nausea and/or vomiting Severe swelling in your hands, feet, and face   Tdap Vaccine It is recommended that you get the Tdap vaccine during the third trimester of EACH pregnancy to help protect your baby from getting pertussis (whooping cough) 27-36 weeks is the BEST time to do  this so that you can pass the protection on to your baby. During pregnancy is better than after pregnancy, but if you are unable to get it during pregnancy it will be offered at the hospital.  You can get this vaccine with [place], at the health department, your family doctor, or some local pharmacies Everyone who will be around your baby should also be up-to-date on their vaccines before the baby comes. Adults (who are not pregnant) only need 1 dose of Tdap during adulthood.   [facility] Pediatricians/Family Doctors [facility]): [address] [place], [facility]: [address] [place], [phone]                [facility]): [place], [phone] (call to ask if accepting patients) [facility] Department: [address], [place], [place] Pediatricians/Family Doctors [facility]): [place]. [place], [address], [place] [facility]: [address] [place], [place] [facility] [place]: [place], [place] [facility]): [phone] [facility]: [address], [place]: [address], [place] [facility]: [phone], [phone]  Home Blood Pressure Monitoring for Patients   Your provider has recommended that you check your  blood pressure (BP) at least once a week at home. If you do not have a blood pressure cuff at home, one will be provided for you. Contact your provider if you have not received your monitor within 1 week.   Helpful Tips for Accurate Home Blood Pressure Checks  Don't smoke, exercise, or drink caffeine 30 minutes before checking your BP Use the restroom before checking your BP (a full bladder can raise your  pressure) Relax in a comfortable upright chair Feet on the ground Left arm resting comfortably on a flat surface at the level of your heart Legs uncrossed Back supported Sit quietly and don't talk Place the cuff on your bare arm Adjust snuggly, so that only two fingertips can fit between your skin and the top of the cuff Check 2 readings separated by at least one minute Keep a log of your BP readings For a visual, please reference this diagram: http://ccnc.care/bpdiagram  Provider Name: [facility]     Phone: [phone]  Zone 1: ALL CLEAR  Continue to monitor your symptoms:  BP reading is less than 140 (top number) or less than 90 (bottom number)  No right upper stomach pain No headaches or seeing spots No feeling nauseated or throwing up No swelling in face and hands  Zone 2: CAUTION Call your doctor's office for any of the following:  BP reading is greater than 140 (top number) or greater than 90 (bottom number)  Stomach pain under your ribs in the middle or right side Headaches or seeing spots Feeling nauseated or throwing up Swelling in face and hands  Zone 3: EMERGENCY  Seek immediate medical care if you have any of the following:  BP reading is greater than160 (top number) or greater than 110 (bottom number) Severe headaches not improving with Tylenol Serious difficulty catching your breath Any worsening symptoms from Zone 2  Preterm Labor and Birth Information  The normal length of a pregnancy is 39-41 weeks. Preterm labor is when labor starts before 37 completed weeks of pregnancy. What are the risk factors for preterm labor? Preterm labor is more likely to occur in women who: Have certain infections during pregnancy such as a bladder infection, sexually transmitted infection, or infection inside the uterus (chorioamnionitis). Have a shorter-than-normal cervix. Have gone into preterm labor before. Have had surgery on their cervix. Are younger than age 17  or older than age 35. Are African American. Are pregnant with twins or multiple babies (multiple gestation). Take street drugs or smoke while pregnant. Do not gain enough weight while pregnant. Became pregnant shortly after having been pregnant. What are the symptoms of preterm labor? Symptoms of preterm labor include: Cramps similar to those that can happen during a menstrual period. The cramps may happen with diarrhea. Pain in the abdomen or lower back. Regular uterine contractions that may feel like tightening of the abdomen. A feeling of increased pressure in the pelvis. Increased watery or bloody mucus discharge from the vagina. Water breaking (ruptured amniotic sac). Why is it important to recognize signs of preterm labor? It is important to recognize signs of preterm labor because babies who are born prematurely may not be fully developed. This can put them at an increased risk for: Long-term (chronic) heart and lung problems. Difficulty immediately after birth with regulating body systems, including blood sugar, body temperature, heart rate, and breathing rate. Bleeding in the brain. Cerebral palsy. Learning difficulties. Death. These risks are highest for babies who are born before 34 weeks   of pregnancy. How is preterm labor treated? Treatment depends on the length of your pregnancy, your condition, and the health of your baby. It may involve: Having a stitch (suture) placed in your cervix to prevent your cervix from opening too early (cerclage). Taking or being given medicines, such as: Hormone medicines. These may be given early in pregnancy to help support the pregnancy. Medicine to stop contractions. Medicines to help mature the baby's lungs. These may be prescribed if the risk of delivery is high. Medicines to prevent your baby from developing cerebral palsy. If the labor happens before 34 weeks of pregnancy, you may need to stay in the hospital. What should I do if I  think I am in preterm labor? If you think that you are going into preterm labor, call your health care provider right away. How can I prevent preterm labor in future pregnancies? To increase your chance of having a full-term pregnancy: Do not use any tobacco products, such as cigarettes, chewing tobacco, and e-cigarettes. If you need help quitting, ask your health care provider. Do not use street drugs or medicines that have not been prescribed to you during your pregnancy. Talk with your health care provider before taking any herbal supplements, even if you have been taking them regularly. Make sure you gain a healthy amount of weight during your pregnancy. Watch for infection. If you think that you might have an infection, get it checked right away. Make sure to tell your health care provider if you have gone into preterm labor before. This information is not intended to replace advice given to you by your health care provider. Make sure you discuss any questions you have with your health care provider. Document Revised: 08/06/2018 Document Reviewed: 09/05/2015 Elsevier Patient Education  2020 Elsevier Inc.  AREA PEDIATRIC/FAMILY PRACTICE PHYSICIANS  [facility] [address] [place], [place] [address] Phone - [phone]   Fax - [phone]  JACK AMOS [address] [place], [place]  [address] Phone - [phone]   Fax - [phone]  [facility] [address] [place], [place]  [address] Phone - [phone]   Fax - [phone]  [facility] [address] [place], [place]  [address] Phone - [phone]   Fax - [phone]  [facility] [address] [place], [place]  [address] Phone - [phone]   Fax - [phone]  [facility] [address] [place], [place]  [address] Phone - [phone]   Fax - [phone]  [facility] [place] [address] [place], [place]  [address] Phone - [phone]   Fax - [phone]  [facility]INE AT [facility] [address] [place], [address] [place], [place]  [address] Phone - [phone]   Fax - [phone]  [facility] FAMILY MEDICINE AT [facility] [address] [place], [place]  [address] Phone - [phone]   Fax - [phone] [facility] MEDICINE AT [place] [address] [place], [place]  [address] Phone - [phone]   Fax - [phone]  [facility] AT [facility] [address] [place], [place]  [address] Phone - [phone]   Fax - [phone]  [facility] AT [facility] [address] [place], [place]  [address] Phone - [phone]   Fax - [phone]  [facility] [place] [address] [place], [place]  [address] Phone - [phone]   Fax -  [phone]  [facility] [address], [address] [place], [place]  [address] Phone - [phone]  [facility] [address] [place], [place]  [address] Phone - [phone]   Fax - [phone]  [facility] [address] [place], [place]  [address] Phone - [phone]   Fax - [phone]  [facility] [address] [place], [place]  [address] Phone - [phone]   Fax - [phone]  [place] [facility] [address] [place], [place]  [address] Phone - [phone]   Fax - [phone]   [facility] [address] [place] [address] [place], [place]  [address] Phone - [phone]   Fax - [phone]  [facility] [address] [place], [place]  [address] Phone - [phone]   Fax - [phone]  DAVID RUBIN [address] [place], [place]  [address] Phone - [phone]   Fax - [phone]  [facility] [address] [place], [place]  [address] Phone - [phone]   Fax -  [phone]  [place] [facility] Alita Chyle [address] [place], [place]  [address] Phone - [phone]   Fax - [phone] Gerarda Fraction [address] [place], [place]  [address] Phone - [phone]   Fax - [phone]  [facility] [address] [place], [place]  [address] Phone - [phone]   Fax - [phone]  [facility] MEDICINE - [place] [address] [place], [place]  [address] Phone - [phone]   Fax - [phone]

## 2022-01-27 NOTE — Progress Notes (Signed)
LOW-RISK PREGNANCY VISIT Patient name: Latasha [name] MRN [id number]  Date of birth: June 28, 1990 Chief Complaint:   Routine Prenatal Visit  History of Present Illness:   Latasha [name] is a 31 y.o. G59P0010 female at [redacted]w[redacted]d with an Estimated Date of Delivery: 03/06/22 being seen today for ongoing management of a low-risk pregnancy.   Today she reports  occ pelvic pressure at night . Contractions: Not present. Vag. Bleeding: None.  Movement: Present. denies leaking of fluid.     08/26/2021    9:32 AM 10/20/2019    8:41 AM 03/27/2017   10:10 AM 03/25/2016    9:25 AM  Depression screen PHQ 2/9  Decreased Interest 0 0 0 0  Down, Depressed, Hopeless 0 1 0 0  PHQ - 2 Score 0 1 0 0  Altered sleeping 0 1    Tired, decreased energy 0 2    Change in appetite 0 2    Feeling bad or failure about yourself  0 0    Trouble concentrating 0 0    Moving slowly or fidgety/restless 0 0    Suicidal thoughts 0 0    PHQ-9 Score 0 6    Difficult doing work/chores  Somewhat difficult          08/26/2021    9:32 AM 10/20/2019    8:42 AM  GAD 7 : Generalized Anxiety Score  Nervous, Anxious, on Edge 0 0  Control/stop worrying 0 0  Worry too much - different things 0 1  Trouble relaxing 0 1  Restless 0 0  Easily annoyed or irritable 0 1  Afraid - awful might happen 0 0  Total GAD 7 Score 0 3  Anxiety Difficulty  Not difficult at all      Review of Systems:   Pertinent items are noted in HPI Denies abnormal vaginal discharge w/ itching/odor/irritation, headaches, visual changes, shortness of breath, chest pain, abdominal pain, severe nausea/vomiting, or problems with urination or bowel movements unless otherwise stated above. Pertinent History Reviewed:  Reviewed past medical,surgical, social, obstetrical and family history.  Reviewed problem list, medications and allergies. Physical Assessment:   Vitals:   01/27/22 1151  BP: 122/75  Pulse: 71  Weight: 224 lb (101.6 kg)  Body mass  index is 36.15 kg/m.        Physical Examination:   General appearance: Well appearing, and in no distress  Mental status: Alert, oriented to person, place, and time  Skin: Warm & dry  Cardiovascular: Normal heart rate noted  Respiratory: Normal respiratory effort, no distress  Abdomen: Soft, gravid, nontender  Pelvic: Cervical exam deferred         Extremities: Edema: None  Fetal Status: Fetal Heart Rate (bpm): 152 Fundal Height: 34 cm Movement: Present    Chaperone: N/A   No results found for this or any previous visit (from the past 24 hour(s)).  Assessment & Plan:  1) Low-risk pregnancy G2P0010 at [redacted]w[redacted]d with an Estimated Date of Delivery: 03/06/22    Meds: No orders of the defined types were placed in this encounter.  Labs/procedures today: none  Plan:  Continue routine obstetrical care  Next visit: prefers will be in person for cultures     Reviewed: Preterm labor symptoms and general obstetric precautions including but not limited to vaginal bleeding, contractions, leaking of fluid and fetal movement were reviewed in detail with the patient.  All questions were answered. Does have home bp cuff. Office bp cuff given: not applicable. Check  bp weekly, let us know if consistently >140 and/or >90.  Follow-up: Return in about 2 weeks (around 02/10/2022) for LROB, CNM, in person.  No future appointments.  No orders of the defined types were placed in this encounter.  Cheral Marker CNM, [facility] 01/27/2022 12:06 PM

## 2022-02-03 ENCOUNTER — Ambulatory Visit (INDEPENDENT_AMBULATORY_CARE_PROVIDER_SITE_OTHER): Payer: Self-pay | Admitting: Pediatrics

## 2022-02-03 DIAGNOSIS — Z7681 Expectant parent(s) prebirth pediatrician visit: Secondary | ICD-10-CM

## 2022-02-03 NOTE — Progress Notes (Signed)
Prenatal counseling for impending newborn done-- Z76.81  

## 2022-02-10 ENCOUNTER — Other Ambulatory Visit (HOSPITAL_COMMUNITY)
Admission: RE | Admit: 2022-02-10 | Discharge: 2022-02-10 | Disposition: A | Discharge status: Home / Self Care | Payer: Medicaid Other | Source: Ambulatory Visit | Attending: Advanced Practice Midwife | Admitting: Advanced Practice Midwife

## 2022-02-10 ENCOUNTER — Encounter: Payer: Self-pay | Admitting: Advanced Practice Midwife

## 2022-02-10 ENCOUNTER — Ambulatory Visit (INDEPENDENT_AMBULATORY_CARE_PROVIDER_SITE_OTHER): Payer: Medicaid Other | Admitting: Advanced Practice Midwife

## 2022-02-10 VITALS — BP 110/77 | HR 73 | Wt 231.0 lb

## 2022-02-10 DIAGNOSIS — Z3480 Encounter for supervision of other normal pregnancy, unspecified trimester: Secondary | ICD-10-CM

## 2022-02-10 DIAGNOSIS — Z3483 Encounter for supervision of other normal pregnancy, third trimester: Secondary | ICD-10-CM | POA: Diagnosis not present

## 2022-02-10 DIAGNOSIS — Z3A36 36 weeks gestation of pregnancy: Secondary | ICD-10-CM | POA: Insufficient documentation

## 2022-02-10 NOTE — Addendum Note (Signed)
Addended by: Jesusita Oka on: 02/10/2022 12:17 PM   Modules accepted: Orders

## 2022-02-10 NOTE — Patient Instructions (Signed)

## 2022-02-10 NOTE — Progress Notes (Signed)
   LOW-RISK PREGNANCY VISIT Patient name: Latasha [name] MRN [id number]  Date of birth: 01-24-1991 Chief Complaint:   Routine Prenatal Visit  History of Present Illness:   Latasha [name] is a 31 y.o. G44P0010 female at [redacted]w[redacted]d with an Estimated Date of Delivery: 03/06/22 being seen today for ongoing management of a low-risk pregnancy.  Today she reports no complaints. Contractions: Not present. Vag. Bleeding: None.  Movement: Present. denies leaking of fluid. Review of Systems:   Pertinent items are noted in HPI Denies abnormal vaginal discharge w/ itching/odor/irritation, headaches, visual changes, shortness of breath, chest pain, abdominal pain, severe nausea/vomiting, or problems with urination or bowel movements unless otherwise stated above. Pertinent History Reviewed:  Reviewed past medical,surgical, social, obstetrical and family history.  Reviewed problem list, medications and allergies. Physical Assessment:   Vitals:   02/10/22 1132  BP: 110/77  Pulse: 73  Weight: 231 lb (104.8 kg)  Body mass index is 37.28 kg/m.        Physical Examination:   General appearance: Well appearing, and in no distress  Mental status: Alert, oriented to person, place, and time  Skin: Warm & dry  Cardiovascular: Normal heart rate noted  Respiratory: Normal respiratory effort, no distress  Abdomen: Soft, gravid, nontender  Pelvic: Cervical exam performed         Extremities: Edema: None  Fetal Status: Fetal Heart Rate (bpm): 148 Fundal Height: 36 cm Movement: Present    Chaperone:  Dr. Owens Shark performed exam     No results found for this or any previous visit (from the past 24 hour(s)).  Assessment & Plan:  1) Low-risk pregnancy G2P0010 at [redacted]w[redacted]d with an Estimated Date of Delivery: 03/06/22      Meds: No orders of the defined types were placed in this encounter.  Labs/procedures today: GBS/GC/CHL  Plan:  Continue routine obstetrical care  Next visit: prefers in person    Reviewed:  Term labor symptoms and general obstetric precautions including but not limited to vaginal bleeding, contractions, leaking of fluid and fetal movement were reviewed in detail with the patient.  All questions were answered. home bp cuff. . Check bp weekly, let us know if >140/90.   Follow-up: Return for As scheduled.  Future Appointments  Date Time Provider [place]  02/17/2022 11:50 AM Roma Schanz, CNM [facility]  02/24/2022  9:10 AM Roma Schanz, CNM [facility]  03/03/2022  8:50 AM Roma Schanz, CNM [facility]  03/10/2022  8:30 AM Cres[place]-D[place], Joaquim Lai, CNM [facility]    No orders of the defined types were placed in this encounter.  Christin Fudge DNP, CNM 02/10/2022 11:54 AM

## 2022-02-11 LAB — CERVICOVAGINAL ANCILLARY ONLY
Chlamydia: NEGATIVE
Comment: NEGATIVE
Comment: NORMAL
Neisseria Gonorrhea: NEGATIVE

## 2022-02-14 LAB — CULTURE, BETA STREP (GROUP B ONLY): Strep Gp B Culture: POSITIVE — AB

## 2022-02-17 ENCOUNTER — Encounter: Payer: Self-pay | Admitting: Women's Health

## 2022-02-17 ENCOUNTER — Ambulatory Visit (INDEPENDENT_AMBULATORY_CARE_PROVIDER_SITE_OTHER): Payer: Medicaid Other | Admitting: Women's Health

## 2022-02-17 VITALS — BP 125/83 | HR 72 | Wt 230.6 lb

## 2022-02-17 DIAGNOSIS — Z3483 Encounter for supervision of other normal pregnancy, third trimester: Secondary | ICD-10-CM

## 2022-02-17 DIAGNOSIS — Z3A37 37 weeks gestation of pregnancy: Secondary | ICD-10-CM

## 2022-02-17 NOTE — Patient Instructions (Signed)
Latasha [name], thank you for choosing our office today! We appreciate the opportunity to meet your healthcare needs. You may receive a short survey by mail, e-mail, or through [employer]. If you are happy with your care we would appreciate if you could take just a few minutes to complete the survey questions. We read all of your comments and take your feedback very seriously. Thank you again for choosing our office.  Center for [employer] Team at [place] [facility] ([place], [place] [address]) Entrance C, located off of [address] parking   CLASSES: Go to [email or web address] to register for classes (childbirth, breastfeeding, waterbirth, infant CPR, daddy bootcamp, etc.)  Call the office [phone]) or go to [facility] if: You begin to have strong, frequent contractions Your water breaks.  Sometimes it is a big gush of fluid, sometimes it is just a trickle that keeps getting your panties wet or running down your legs You have vaginal bleeding.  It is normal to have a small amount of spotting if your cervix was checked.  You don't feel your baby moving like normal.  If you don't, get you something to eat and drink and lay down and focus on feeling your baby move.   If your baby is still not moving like normal, you should call the office or go to [facility].  Call the office [phone]) or go to [facility] hospital for these signs of pre-eclampsia: Severe headache that does not go away with Tylenol Visual changes- seeing spots, double, blurred vision Pain under your right breast or upper abdomen that does not go away with Tums or heartburn medicine Nausea and/or vomiting Severe swelling in your hands, feet, and face   [facility] Pediatricians/Family Doctors [facility]): [address] [place], [place]: [address] [place], [facility]): [place], [phone] (call to ask if accepting patients) [facility] Department: [address] [place], [facility]): [address], [facility]: [address] [place], [place] [place]: [place], [facility]): [phone] [facility]: [address], [place]: [address] [place] [facility]: [phone], [phone]  Home Blood Pressure Monitoring for Patients   Your provider has recommended that you check your blood pressure (BP) at least once a week at home. If you do not have a blood pressure cuff at home, one will be provided for you. Contact your provider if you have not received your monitor within 1 week.   Helpful Tips for Accurate Home Blood Pressure Checks  Don't smoke, exercise, or drink caffeine 30 minutes before checking your BP Use the restroom before checking your BP (a full bladder can raise your pressure) Relax in a comfortable upright chair Feet on the ground Left arm resting comfortably on a flat surface at the level of your heart Legs uncrossed Back supported Sit quietly and don't talk Place the cuff on your bare arm Adjust snuggly, so that only two fingertips  can fit between your skin and the top of the cuff Check 2 readings separated by at least one minute Keep a log of your BP readings For a visual, please reference this diagram: http://ccnc.care/bpdiagram  Provider Name: [facility]     Phone: [phone]  Zone 1: ALL CLEAR  Continue to monitor your symptoms:  BP reading is less than 140 (top number) or less than 90 (bottom number)  No right  upper stomach pain No headaches or seeing spots No feeling nauseated or throwing up No swelling in face and hands  Zone 2: CAUTION Call your doctor's office for any of the following:  BP reading is greater than 140 (top number) or greater than 90 (bottom number)  Stomach pain under your ribs in the middle or right side Headaches or seeing spots Feeling nauseated or throwing up Swelling in face and hands  Zone 3: EMERGENCY  Seek immediate medical care if you have any of the following:  BP reading is greater than160 (top number) or greater than 110 (bottom number) Severe headaches not improving with Tylenol Serious difficulty catching your breath Any worsening symptoms from Zone 2   Braxton Hicks Contractions Contractions of the uterus can occur throughout pregnancy, but they are not always a sign that you are in labor. You may have practice contractions called Braxton Hicks contractions. These false labor contractions are sometimes confused with true labor. What are Latasha [name] contractions? Braxton Hicks contractions are tightening movements that occur in the muscles of the uterus before labor. Unlike true labor contractions, these contractions do not result in opening (dilation) and thinning of the cervix. Toward the end of pregnancy (32-34 weeks), Braxton Hicks contractions can happen more often and may become stronger. These contractions are sometimes difficult to tell apart from true labor because they can be very uncomfortable. You should not feel embarrassed if you go to the hospital with false labor. Sometimes, the only way to tell if you are in true labor is for your health care provider to look for changes in the cervix. The health care provider will do a physical exam and may monitor your contractions. If you are not in true labor, the exam should show that your cervix is not dilating and your water has not broken. If there are no other health problems associated with your  pregnancy, it is completely safe for you to be sent home with false labor. You may continue to have Braxton Hicks contractions until you go into true labor. How to tell the difference between true labor and false labor True labor Contractions last 30-70 seconds. Contractions become very regular. Discomfort is usually felt in the top of the uterus, and it spreads to the lower abdomen and low back. Contractions do not go away with walking. Contractions usually become more intense and increase in frequency. The cervix dilates and gets thinner. False labor Contractions are usually shorter and not as strong as true labor contractions. Contractions are usually irregular. Contractions are often felt in the front of the lower abdomen and in the groin. Contractions may go away when you walk around or change positions while lying down. Contractions get weaker and are shorter-lasting as time goes on. The cervix usually does not dilate or become thin. Follow these instructions at home:  Take over-the-counter and prescription medicines only as told by your health care provider. Keep up with your usual exercises and follow other instructions from your health care provider. Eat and drink lightly if you think  you are going into labor. If Braxton Hicks contractions are making you uncomfortable: Change your position from lying down or resting to walking, or change from walking to resting. Sit and rest in a tub of warm water. Drink enough fluid to keep your urine pale yellow. Dehydration may cause these contractions. Do slow and deep breathing several times an hour. Keep all follow-up prenatal visits as told by your health care provider. This is important. Contact a health care provider if: You have a fever. You have continuous pain in your abdomen. Get help right away if: Your contractions become stronger, more regular, and closer together. You have fluid leaking or gushing from your vagina. You pass  blood-tinged mucus (bloody show). You have bleeding from your vagina. You have low back pain that you never had before. You feel your baby's head pushing down and causing pelvic pressure. Your baby is not moving inside you as much as it used to. Summary Contractions that occur before labor are called Braxton Hicks contractions, false labor, or practice contractions. Braxton Hicks contractions are usually shorter, weaker, farther apart, and less regular than true labor contractions. True labor contractions usually become progressively stronger and regular, and they become more frequent. Manage discomfort from [facility] contractions by changing position, resting in a warm bath, drinking plenty of water, or practicing deep breathing. This information is not intended to replace advice given to you by your health care provider. Make sure you discuss any questions you have with your health care provider. Document Revised: 03/27/2017 Document Reviewed: 08/28/2016 Elsevier Patient Education  [place].

## 2022-02-17 NOTE — Progress Notes (Signed)
LOW-RISK PREGNANCY VISIT Patient name: RIGBY SWAMY MRN [id number]  Date of birth: 04-30-1990 Chief Complaint:   Routine Prenatal Visit  History of Present Illness:   Latasha [name] is a 31 y.o. G62P0010 female at [redacted]w[redacted]d with an Estimated Date of Delivery: 03/06/22 being seen today for ongoing management of a low-risk pregnancy.   Today she reports occasional contractions. Contractions: Irritability. Vag. Bleeding: None.  Movement: Present. denies leaking of fluid.     08/26/2021    9:32 AM 10/20/2019    8:41 AM 03/27/2017   10:10 AM 03/25/2016    9:25 AM  Depression screen PHQ 2/9  Decreased Interest 0 0 0 0  Down, Depressed, Hopeless 0 1 0 0  PHQ - 2 Score 0 1 0 0  Altered sleeping 0 1    Tired, decreased energy 0 2    Change in appetite 0 2    Feeling bad or failure about yourself  0 0    Trouble concentrating 0 0    Moving slowly or fidgety/restless 0 0    Suicidal thoughts 0 0    PHQ-9 Score 0 6    Difficult doing work/chores  Somewhat difficult          08/26/2021    9:32 AM 10/20/2019    8:42 AM  GAD 7 : Generalized Anxiety Score  Nervous, Anxious, on Edge 0 0  Control/stop worrying 0 0  Worry too much - different things 0 1  Trouble relaxing 0 1  Restless 0 0  Easily annoyed or irritable 0 1  Afraid - awful might happen 0 0  Total GAD 7 Score 0 3  Anxiety Difficulty  Not difficult at all      Review of Systems:   Pertinent items are noted in HPI Denies abnormal vaginal discharge w/ itching/odor/irritation, headaches, visual changes, shortness of breath, chest pain, abdominal pain, severe nausea/vomiting, or problems with urination or bowel movements unless otherwise stated above. Pertinent History Reviewed:  Reviewed past medical,surgical, social, obstetrical and family history.  Reviewed problem list, medications and allergies. Physical Assessment:   Vitals:   02/17/22 1146  BP: 125/83  Pulse: 72  Weight: 230 lb 9.6 oz (104.6 kg)  Body mass  index is 37.22 kg/m.        Physical Examination:   General appearance: Well appearing, and in no distress  Mental status: Alert, oriented to person, place, and time  Skin: Warm & dry  Cardiovascular: Normal heart rate noted  Respiratory: Normal respiratory effort, no distress  Abdomen: Soft, gravid, nontender  Pelvic: Cervical exam performed  Dilation: 1.5 Effacement (%): 70 Station: Ballotable  Extremities: Edema: None  Fetal Status: Fetal Heart Rate (bpm): 138 Fundal Height: 36 cm Movement: Present Presentation: Vertex  Chaperone: Faith Rogue   No results found for this or any previous visit (from the past 24 hour(s)).  Assessment & Plan:  1) Low-risk pregnancy G2P0010 at [redacted]w[redacted]d with an Estimated Date of Delivery: 03/06/22    Meds: No orders of the defined types were placed in this encounter.  Labs/procedures today: SVE  Plan:  Continue routine obstetrical care  Next visit: prefers in person    Reviewed: Term labor symptoms and general obstetric precautions including but not limited to vaginal bleeding, contractions, leaking of fluid and fetal movement were reviewed in detail with the patient.  All questions were answered. Does have home bp cuff. Office bp cuff given: not applicable. Check bp weekly, let us know if consistently >  140 and/or >90.  Follow-up: Return for weekly, As scheduled.  Future Appointments  Date Time Provider [place]  02/24/2022  9:10 AM Roma Schanz, CNM [facility]  03/03/2022  8:50 AM Roma Schanz, CNM [facility]  03/10/2022  8:30 AM Cresenzo-Dishmon, Joaquim Lai, CNM [facility]    No orders of the defined types were placed in this encounter.   [place], [facility] 02/17/2022 12:35 PM

## 2022-02-24 ENCOUNTER — Ambulatory Visit (INDEPENDENT_AMBULATORY_CARE_PROVIDER_SITE_OTHER): Payer: Medicaid Other | Admitting: Women's Health

## 2022-02-24 ENCOUNTER — Encounter: Payer: Self-pay | Admitting: Women's Health

## 2022-02-24 ENCOUNTER — Other Ambulatory Visit: Payer: Self-pay

## 2022-02-24 ENCOUNTER — Inpatient Hospital Stay (HOSPITAL_COMMUNITY)
Admission: AD | Admit: 2022-02-24 | Discharge: 2022-02-26 | DRG: 807 | Disposition: A | Discharge status: Home / Self Care | Payer: Medicaid Other | Attending: Obstetrics & Gynecology | Admitting: Obstetrics & Gynecology

## 2022-02-24 ENCOUNTER — Encounter (HOSPITAL_COMMUNITY): Payer: Self-pay | Admitting: Obstetrics and Gynecology

## 2022-02-24 VITALS — BP 140/92 | HR 81 | Wt 225.0 lb

## 2022-02-24 DIAGNOSIS — O134 Gestational [pregnancy-induced] hypertension without significant proteinuria, complicating childbirth: Principal | ICD-10-CM | POA: Diagnosis present

## 2022-02-24 DIAGNOSIS — O133 Gestational [pregnancy-induced] hypertension without significant proteinuria, third trimester: Principal | ICD-10-CM

## 2022-02-24 DIAGNOSIS — O1493 Unspecified pre-eclampsia, third trimester: Secondary | ICD-10-CM

## 2022-02-24 DIAGNOSIS — O9902 Anemia complicating childbirth: Secondary | ICD-10-CM | POA: Diagnosis present

## 2022-02-24 DIAGNOSIS — O326XX1 Maternal care for compound presentation, fetus 1: Secondary | ICD-10-CM | POA: Diagnosis not present

## 2022-02-24 DIAGNOSIS — O99824 Streptococcus B carrier state complicating childbirth: Secondary | ICD-10-CM | POA: Diagnosis present

## 2022-02-24 DIAGNOSIS — B951 Streptococcus, group B, as the cause of diseases classified elsewhere: Secondary | ICD-10-CM

## 2022-02-24 DIAGNOSIS — F17210 Nicotine dependence, cigarettes, uncomplicated: Secondary | ICD-10-CM | POA: Diagnosis present

## 2022-02-24 DIAGNOSIS — O99334 Smoking (tobacco) complicating childbirth: Secondary | ICD-10-CM | POA: Diagnosis present

## 2022-02-24 DIAGNOSIS — J45909 Unspecified asthma, uncomplicated: Secondary | ICD-10-CM | POA: Diagnosis present

## 2022-02-24 DIAGNOSIS — Z3A38 38 weeks gestation of pregnancy: Secondary | ICD-10-CM

## 2022-02-24 DIAGNOSIS — O139 Gestational [pregnancy-induced] hypertension without significant proteinuria, unspecified trimester: Principal | ICD-10-CM | POA: Diagnosis present

## 2022-02-24 DIAGNOSIS — Z148 Genetic carrier of other disease: Secondary | ICD-10-CM

## 2022-02-24 DIAGNOSIS — Z2839 Other underimmunization status: Secondary | ICD-10-CM

## 2022-02-24 DIAGNOSIS — O9952 Diseases of the respiratory system complicating childbirth: Secondary | ICD-10-CM | POA: Diagnosis present

## 2022-02-24 DIAGNOSIS — Z3490 Encounter for supervision of normal pregnancy, unspecified, unspecified trimester: Secondary | ICD-10-CM

## 2022-02-24 DIAGNOSIS — O99019 Anemia complicating pregnancy, unspecified trimester: Secondary | ICD-10-CM | POA: Diagnosis present

## 2022-02-24 DIAGNOSIS — Z8759 Personal history of other complications of pregnancy, childbirth and the puerperium: Secondary | ICD-10-CM | POA: Diagnosis present

## 2022-02-24 DIAGNOSIS — O09899 Supervision of other high risk pregnancies, unspecified trimester: Secondary | ICD-10-CM

## 2022-02-24 DIAGNOSIS — O0993 Supervision of high risk pregnancy, unspecified, third trimester: Secondary | ICD-10-CM

## 2022-02-24 LAB — POCT URINALYSIS DIPSTICK OB
Blood, UA: NEGATIVE
Glucose, UA: NEGATIVE
Ketones, UA: NEGATIVE
Leukocytes, UA: NEGATIVE
Nitrite, UA: NEGATIVE

## 2022-02-24 LAB — PROTEIN / CREATININE RATIO, URINE
Creatinine, Urine: 166 mg/dL
Protein Creatinine Ratio: 0.08 mg/mg{Cre} (ref 0.00–0.15)
Total Protein, Urine: 13 mg/dL

## 2022-02-24 LAB — CBC
HCT: 33.7 % — ABNORMAL LOW (ref 36.0–46.0)
Hemoglobin: 11.1 g/dL — ABNORMAL LOW (ref 12.0–15.0)
MCH: 26 pg (ref 26.0–34.0)
MCHC: 32.9 g/dL (ref 30.0–36.0)
MCV: 78.9 fL — ABNORMAL LOW (ref 80.0–100.0)
Platelets: 350 10*3/uL (ref 150–400)
RBC: 4.27 MIL/uL (ref 3.87–5.11)
RDW: 14.2 % (ref 11.5–15.5)
WBC: 8.5 10*3/uL (ref 4.0–10.5)
nRBC: 0 % (ref 0.0–0.2)

## 2022-02-24 LAB — COMPREHENSIVE METABOLIC PANEL
ALT: 10 U/L (ref 0–44)
AST: 18 U/L (ref 15–41)
Albumin: 2.8 g/dL — ABNORMAL LOW (ref 3.5–5.0)
Alkaline Phosphatase: 133 U/L — ABNORMAL HIGH (ref 38–126)
Anion gap: 14 (ref 5–15)
BUN: 7 mg/dL (ref 6–20)
CO2: 17 mmol/L — ABNORMAL LOW (ref 22–32)
Calcium: 9 mg/dL (ref 8.9–10.3)
Chloride: 103 mmol/L (ref 98–111)
Creatinine, Ser: 0.79 mg/dL (ref 0.44–1.00)
GFR, Estimated: >60 mL/min (ref >60)
Glucose, Bld: 121 mg/dL — ABNORMAL HIGH (ref 70–99)
Potassium: 3.5 mmol/L (ref 3.5–5.1)
Sodium: 134 mmol/L — ABNORMAL LOW (ref 135–145)
Total Bilirubin: 0.2 mg/dL — ABNORMAL LOW (ref 0.3–1.2)
Total Protein: 7 g/dL (ref 6.5–8.1)

## 2022-02-24 LAB — TYPE AND SCREEN
ABO/RH(D): O POS
Antibody Screen: NEGATIVE

## 2022-02-24 MED ORDER — OXYTOCIN-SODIUM CHLORIDE 30-0.9 UT/500ML-% IV SOLN: 2.5000 [IU]/h | INTRAVENOUS | Status: DC

## 2022-02-24 MED ORDER — LACTATED RINGERS IV SOLN: 500.0000 mL | INTRAVENOUS | Status: DC | PRN

## 2022-02-24 MED ORDER — FLEET ENEMA 7-19 GM/118ML RE ENEM: 1.0000 | ENEMA | RECTAL | Status: DC | PRN

## 2022-02-24 MED ORDER — SOD CITRATE-CITRIC ACID 500-334 MG/5ML PO SOLN: 30.0000 mL | ORAL | Status: DC | PRN

## 2022-02-24 MED ORDER — PENICILLIN G POT IN DEXTROSE 60000 UNIT/ML IV SOLN
3.0000 10*6.[IU] | INTRAVENOUS | Status: DC
  Administered 2022-02-24 – 2022-02-25 (×2): 3 10*6.[IU] via INTRAVENOUS
  Filled 2022-02-24 (×2): qty 50

## 2022-02-24 MED ORDER — LIDOCAINE HCL (PF) 1 % IJ SOLN: 30.0000 mL | INTRAMUSCULAR | Status: DC | PRN

## 2022-02-24 MED ORDER — TERBUTALINE SULFATE 1 MG/ML IJ SOLN: 0.2500 mg | Freq: Once | INTRAMUSCULAR | Status: DC | PRN

## 2022-02-24 MED ORDER — OXYTOCIN-SODIUM CHLORIDE 30-0.9 UT/500ML-% IV SOLN
1.0000 m[IU]/min | INTRAVENOUS | Status: DC
  Administered 2022-02-24: 2 m[IU]/min via INTRAVENOUS
  Filled 2022-02-24: qty 500

## 2022-02-24 MED ORDER — OXYCODONE-ACETAMINOPHEN 5-325 MG PO TABS: 2.0000 | ORAL_TABLET | ORAL | Status: DC | PRN

## 2022-02-24 MED ORDER — LACTATED RINGERS IV SOLN: INTRAVENOUS | Status: DC

## 2022-02-24 MED ORDER — ACETAMINOPHEN 325 MG PO TABS: 650.0000 mg | ORAL_TABLET | ORAL | Status: DC | PRN

## 2022-02-24 MED ORDER — ONDANSETRON HCL 4 MG/2ML IJ SOLN
4.0000 mg | Freq: Four times a day (QID) | INTRAMUSCULAR | Status: DC | PRN
  Administered 2022-02-25: 4 mg via INTRAVENOUS
  Filled 2022-02-24: qty 2

## 2022-02-24 MED ORDER — OXYTOCIN BOLUS FROM INFUSION
333.0000 mL | Freq: Once | INTRAVENOUS | Status: AC
  Administered 2022-02-25: 333 mL via INTRAVENOUS

## 2022-02-24 MED ORDER — OXYCODONE-ACETAMINOPHEN 5-325 MG PO TABS: 1.0000 | ORAL_TABLET | ORAL | Status: DC | PRN

## 2022-02-24 MED ORDER — SODIUM CHLORIDE 0.9 % IV SOLN
5.0000 10*6.[IU] | Freq: Once | INTRAVENOUS | Status: AC
  Administered 2022-02-24: 5 10*6.[IU] via INTRAVENOUS
  Filled 2022-02-24: qty 5

## 2022-02-24 NOTE — Progress Notes (Signed)
HIGH-RISK PREGNANCY VISIT Patient name: Latasha [name] MRN [id number]  Date of birth: [birth year] Chief Complaint:   Routine Prenatal Visit  History of Present Illness:   Latasha [name] is a 31 y.o. G44P0010 female at [redacted]w[redacted]d with an Estimated Date of Delivery: 03/06/22 being seen today for ongoing management of a high-risk pregnancy complicated by pre-eclampsia diagnosed today.   Today she reports  n/v, but has had whole pregnancy . Denies ha, visual changes, ruq/epigastric pain, n/v.  Not checking home bp's. Lots of pressure, some contractions.  Contractions: Irritability. Vag. Bleeding: None.  Movement: Present. denies leaking of fluid.     08/26/2021    9:32 AM 10/20/2019    8:41 AM 03/27/2017   10:10 AM 03/25/2016    9:25 AM  Depression screen PHQ 2/9  Decreased Interest 0 0 0 0  Down, Depressed, Hopeless 0 1 0 0  PHQ - 2 Score 0 1 0 0  Altered sleeping 0 1    Tired, decreased energy 0 2    Change in appetite 0 2    Feeling bad or failure about yourself  0 0    Trouble concentrating 0 0    Moving slowly or fidgety/restless 0 0    Suicidal thoughts 0 0    PHQ-9 Score 0 6    Difficult doing work/chores  Somewhat difficult          08/26/2021    9:32 AM 10/20/2019    8:42 AM  GAD 7 : Generalized Anxiety Score  Nervous, Anxious, on Edge 0 0  Control/stop worrying 0 0  Worry too much - different things 0 1  Trouble relaxing 0 1  Restless 0 0  Easily annoyed or irritable 0 1  Afraid - awful might happen 0 0  Total GAD 7 Score 0 3  Anxiety Difficulty  Not difficult at all      Review of Systems:   Pertinent items are noted in HPI Denies abnormal vaginal discharge w/ itching/odor/irritation, headaches, visual changes, shortness of breath, chest pain, abdominal pain, severe nausea/vomiting, or problems with urination or bowel movements unless otherwise stated above. Pertinent History Reviewed:  Reviewed past medical,surgical, social, obstetrical and family history.   Reviewed problem list, medications and allergies. Physical Assessment:   Vitals:   02/24/22 0914 02/24/22 0940  BP: (!) 134/93 (!) 140/92  Pulse: 81 81  Weight: 225 lb (102.1 kg)   Body mass index is 36.32 kg/m.        Physical Examination:   General appearance: Well appearing, and in no distress  Mental status: Alert, oriented to person, place, and time  Skin: Warm & dry  Cardiovascular: Normal heart rate noted  Respiratory: Normal respiratory effort, no distress  Abdomen: Soft, gravid, nontender  Pelvic: Cervical exam performed  Dilation: 3 Effacement (%): 80 Station: -2  Extremities: Edema: Trace  Fetal Status: Fetal Heart Rate (bpm): 135 Fundal Height: 37 cm Movement: Present Presentation: Vertex  Chaperone: Andrez Grime   Results for orders placed or performed in visit on 02/24/22 (from the past 24 hour(s))  POC Urinalysis Dipstick OB   Collection Time: 02/24/22  9:44 AM  Result Value Ref Range   Color, UA     Clarity, UA     Glucose, UA Negative Negative   Bilirubin, UA     Ketones, UA negative    Spec Grav, UA     Blood, UA negative    pH, UA     POC,PROTEIN,UA Moderate (  2+) Negative, Trace, Small (1+), Moderate (2+), Large (3+), 4+   Urobilinogen, UA     Nitrite, UA negative    Leukocytes, UA Negative Negative   Appearance     Odor      Assessment & Plan:  1) High-risk pregnancy G2P0010 at [redacted]w[redacted]d with an Estimated Date of Delivery: 03/06/22   2) Presumed preeclampsia, new elevated bp's w/ 2+ proteinuria, asymptomatic. Some n/v, but has had whole pregnancy. Discussed w/ Leward Quan, L&D charge (no IOLs holding), will direct admit, notified labor team as well.    Meds: No orders of the defined types were placed in this encounter.  Labs/procedures today: SVE  Plan:  to [facility]  Follow-up: Return for will schedule bp check after delivery.  Future Appointments  Date Time Provider [place]  03/03/2022  8:50 AM Roma Schanz, CNM [facility]   03/10/2022  8:30 AM Vevelyn Pat, Joaquim Lai, CNM [facility]    Orders Placed This Encounter  Procedures   POC Urinalysis Dipstick OB   Roma Schanz CNM, [facility] 02/24/2022 10:05 AM

## 2022-02-24 NOTE — H&P (Addendum)
OBSTETRIC ADMISSION HISTORY AND PHYSICAL  Latasha [name] is a 31 y.o. female G2P0010 with IUP at [redacted]w[redacted]d by 6 week [place] presenting for scheduled IOL due gHTN- concerns for preeclampsia.  Pt noted to have elevated BP and 2+ protein in the office.  She reports +FMs, No LOF, no VB, no blurry vision, headaches or peripheral edema, and RUQ pain.  She plans on breast feeding. She request nexplanon for birth control. She received her prenatal care at [facility]   Dating: By 6w ultrasound --->  Estimated Date of Delivery: 03/06/22  Sono:    @[redacted]w[redacted]d , CWD, normal anatomy, cephalic presentation, posterior lie, 528g, 78% EFW   Prenatal History/Complications:  gHTN- diagnosed today Hx of diabetes, resolved after losing weight     FAMILY TREE  RESULTS  Language English Pap 09/26/21 neg  Initiated care at 12wks GC/CT Initial:  -/-          36wks:-/-  Dating by 6wk 11/26/21    Support person  Genetics NT/IT: neg/neg    AFP:      Panorama: LR female  BP cuff Has bp cuff Carrier Screen +silent carrier alpha-thal    [place]/Hgb Elec 02/22/13 neg  Rhogam n/a    TDaP vaccine 12/27/21  Blood Type O/Positive/-- (05/01 1048)  Flu vaccine declined Antibody Negative (05/01 1048)  Covid vaccine  HBsAg Negative (05/01 1048)    RPR Non Reactive (05/01 1048)  Anatomy 12-12-1997 Nl boy "Xaddius" Rubella  <0.90 (05/01 1048)  Feeding Plan Breast HIV Non Reactive (05/01 1048)  Contraception Nexplanon Hep C neg  Circumcision Yes    Pediatrician List given A1C/GTT Early: 5.8>GTT wnl     26-28wks:80/155/71  Prenatal Classes discussed      GBS     [ ]  PCN allergy  BTL Consent     VBAC Consent  PHQ9 & GAD7  [ ] New OB  [ ] 28wks   [ ] 36wks  Waterbirth [ ] Class [ ]  36wkCNM visit/consent      Past Medical History: Past Medical History:  Diagnosis Date   Asthma    Diabetes mellitus without complication (HCC)    Vaginal Pap smear, abnormal     Past Surgical History: Past Surgical History:  Procedure Laterality Date   CERVICAL  POLYPECTOMY     GALLBLADDER SURGERY  07/2019    Obstetrical History: OB History     Gravida  2   Para      Term      Preterm      AB  1   Living  0      SAB  1   IAB      Ectopic      Multiple      Live Births              Social History Social History   Socioeconomic History   Marital status: Married    Spouse name: Not on file   Number of children: Not on file   Years of education: Not on file   Highest education level: Not on file  Occupational History   Not on file  Tobacco Use   Smoking status: Some Days    Types: Cigarettes   Smokeless tobacco: Never  Vaping Use   Vaping Use: Never used  Substance and Sexual Activity   Alcohol use: No    Alcohol/week: 0.0 standard drinks of alcohol   Drug use: No    Types: Marijuana    Comment: not now   Sexual  activity: Yes    Birth control/protection: None  Other Topics Concern   Not on file  Social History Narrative   Not on file   Social Determinants of Health   Financial Resource Strain: Low Risk  (08/26/2021)   Overall Financial Resource Strain (CARDIA)    Difficulty of Paying Living Expenses: Not very hard  Food Insecurity: No Food Insecurity (02/24/2022)   Hunger Vital Sign    Worried About Running Out of Food in the Last Year: Never true    Ran Out of Food in the Last Year: Never true  Transportation Needs: No Transportation Needs (02/24/2022)   PRAPARE - Administrator, Civil Service (Medical): No    Lack of Transportation (Non-Medical): No  Physical Activity: Sufficiently Active (08/26/2021)   Exercise Vital Sign    Days of Exercise per Week: 6 days    Minutes of Exercise per Session: 40 min  Stress: No Stress Concern Present (08/26/2021)   [employer] of Occupational Health - Occupational Stress Questionnaire    Feeling of Stress : Not at all  Social Connections: Moderately Integrated (08/26/2021)   Social Connection and Isolation Panel [NHANES]    Frequency of  Communication with Friends and Family: More than three times a week    Frequency of Social Gatherings with Friends and Family: Twice a week    Attends Religious Services: More than 4 times per year    Active Member of [employer] or Organizations: No    Attends Engineer, structural: Never    Marital Status: Married    Family History: Family History  Problem Relation Age of Onset   Cancer Mother        breast, cervical   Stroke Mother    Hypertension Mother    Kidney disease Father    Anuerysm Brother    Stroke Maternal Aunt    Hypertension Paternal Aunt    Diabetes Paternal Uncle    Hypertension Paternal Uncle    Kidney disease Paternal Uncle    Hypertension Paternal Uncle    Hypertension Maternal Grandmother    Cancer Maternal Grandfather    Diabetes Paternal Grandmother    Diabetes Paternal Grandfather     Allergies: No Known Allergies  Medications Prior to Admission  Medication Sig Dispense Refill Last Dose   albuterol (PROVENTIL HFA;VENTOLIN HFA) 108 (90 BASE) MCG/ACT inhaler Inhale 2 puffs into the lungs every 6 (six) hours as needed for wheezing or shortness of breath. 1 Inhaler 2 Past Week   docusate sodium (PHILLIPS STOOL SOFTENER) 100 MG capsule Take 1 capsule (100 mg total) by mouth 2 (two) times daily. 60 capsule 3 Past Week   iron polysaccharides (NIFEREX) 150 MG capsule Take 1 capsule (150 mg total) by mouth every other day. 30 capsule 3 02/23/2022   Prenatal MV & Min w/FA-DHA ([facility] PRENATAL GUMMY PO) Take by mouth.   02/23/2022   aspirin 81 MG EC tablet Take 1 tablet (81 mg total) by mouth daily. Swallow whole. (Patient not taking: Reported on 01/13/2022) 90 tablet 3    Doxylamine-Pyridoxine (DICLEGIS) 10-10 MG TBEC 2 tabs q hs, if sx persist add 1 tab q am on day 3, if sx persist add 1 tab q afternoon on day 4 (Patient not taking: Reported on 02/10/2022) 100 tablet 6    terconazole (TERAZOL 7) 0.4 % vaginal cream Place 1 applicator vaginally at bedtime.  (Patient not taking: Reported on 10/31/2021) 45 g 0      Review of Systems  All systems reviewed and negative except as stated in HPI  Blood pressure (!) 129/91, pulse 66, temperature 97.7 F (36.5 C), temperature source Oral, resp. rate 18, height 5\' 6"  (1.676 m), weight 101.6 kg. General appearance: alert, cooperative, appears stated age, and moderately obese Lungs: clear to auscultation bilaterally Heart: regular rate and rhythm Abdomen: soft, non-tender; bowel sounds normal Extremities: Homans sign is negative, no sign of DVT Presentation: cephalic by bedside ultrasound Fetal monitoring Baseline 140, moderate variability, + accels, - decels  Uterine activity None Dilation: 3 Effacement (%): 80 Station: -2 Exam by::  (by kim booker in office)  Prenatal labs: ABO, Rh: --/--/O POS (10/30 1405) Antibody: NEG (10/30 1405) Rubella: <0.90 (05/01 1048) RPR: Non Reactive (08/04 0859)  HBsAg: Negative (05/01 1048)  HIV: Non Reactive (08/04 0859)  GBS: Positive/-- (10/16 1401)  2 hr GTT: normal Genetic screening  + silent carrier alpha thal, otherwise nml  Anatomy 10-27-2002 wnl   Prenatal Transfer Tool  Maternal Diabetes: No Genetic Screening: Normal Maternal Ultrasounds/Referrals: Normal Fetal Ultrasounds or other Referrals:  None Maternal Substance Abuse:  No Significant Maternal Medications:  None Significant Maternal Lab Results:  Group B Strep positive Number of Prenatal Visits:greater than 3 verified prenatal visits Other Comments:  None  Results for orders placed or performed during the hospital encounter of 02/24/22 (from the past 24 hour(s))  Type and screen [facility]   Collection Time: 02/24/22  2:05 PM  Result Value Ref Range   ABO/RH(D) O POS    Antibody Screen NEG    Sample Expiration      02/27/2022,2359 Performed at [facility], [address] [place], [place]   CBC   Collection Time: 02/24/22  2:06 PM  Result Value Ref  Range   WBC 8.5 4.0 - 10.5 K/uL   RBC 4.27 3.87 - 5.11 MIL/uL   Hemoglobin 11.1 (L) 12.0 - 15.0 g/dL   HCT 02/26/22 (L) 57.3 - 22.0 %   MCV 78.9 (L) 80.0 - 100.0 fL   MCH 26.0 26.0 - 34.0 pg   MCHC 32.9 30.0 - 36.0 g/dL   RDW 25.4 27.0 - 62.3 %   Platelets 350 150 - 400 K/uL   nRBC 0.0 0.0 - 0.2 %  Comprehensive metabolic panel   Collection Time: 02/24/22  2:06 PM  Result Value Ref Range   Sodium 134 (L) 135 - 145 mmol/L   Potassium 3.5 3.5 - 5.1 mmol/L   Chloride 103 98 - 111 mmol/L   CO2 17 (L) 22 - 32 mmol/L   Glucose, Bld 121 (H) 70 - 99 mg/dL   BUN 7 6 - 20 mg/dL   Creatinine, Ser 02/26/22 0.44 - 1.00 mg/dL   Calcium 9.0 8.9 - 8.31 mg/dL   Total Protein 7.0 6.5 - 8.1 g/dL   Albumin 2.8 (L) 3.5 - 5.0 g/dL   AST 18 15 - 41 U/L   ALT 10 0 - 44 U/L   Alkaline Phosphatase 133 (H) 38 - 126 U/L   Total Bilirubin 0.2 (L) 0.3 - 1.2 mg/dL   GFR, Estimated 51.7 >61 mL/min   Anion gap 14 5 - 15  Protein / creatinine ratio, urine   Collection Time: 02/24/22  3:53 PM  Result Value Ref Range   Creatinine, Urine 166 mg/dL   Total Protein, Urine 13 mg/dL   Protein Creatinine Ratio 0.08 0.00 - 0.15 mg/mg[Cre]  Results for orders placed or performed in visit on 02/24/22 (from the  past 24 hour(s))  POC Urinalysis Dipstick OB   Collection Time: 02/24/22  9:44 AM  Result Value Ref Range   Color, UA     Clarity, UA     Glucose, UA Negative Negative   Bilirubin, UA     Ketones, UA negative    Spec Grav, UA     Blood, UA negative    pH, UA     POC,PROTEIN,UA Moderate (2+) Negative, Trace, Small (1+), Moderate (2+), Large (3+), 4+   Urobilinogen, UA     Nitrite, UA negative    Leukocytes, UA Negative Negative   Appearance     Odor      Patient Active Problem List   Diagnosis Date Noted   Pre-eclampsia in third trimester 02/24/2022   Positive GBS test 02/24/2022   Pre-eclampsia during pregnancy in third trimester, antepartum 02/24/2022   Anemia affecting pregnancy 12/01/2021   Alpha  thalassemia silent carrier 09/10/2021   History of pancreatitis 08/26/2021   Encounter for supervision of normal pregnancy, antepartum 08/23/2021   Dysplasia of cervix, unspecified 04/12/2013   History of diabetes mellitus 04/12/2013   Asthma 03/07/2013   Rubella non-immune status, antepartum 02/23/2013    Assessment/Plan:  Latasha [name] is a 31 y.o. G2P0010 at [redacted]w[redacted]d here for IOL for gHTN -Gestational HTN Labs normal, no evidence of preeclampsia Mild range BPs, will continue to monitor  #Labor:latent, favorable cervix; Start pitocin 2x2 #Pain: Plans for IV fentanyl  #FWB: Cat 1  #ID:  GBS pos> PCN #MOF: Breast #MOC:Nexplanon inpatient  #Circ:  Yes  Julianne Handler, CNM  02/24/2022, 5:20 PM

## 2022-02-24 NOTE — Progress Notes (Signed)
Labor Progress Note Latasha [name] is a 31 y.o. G2P0010 at [redacted]w[redacted]d presented for IOL for gHTN.  S: Patient is resting comfortably. Feeling contractions intermittently. No complaints.   O:  BP 134/87   Pulse 69   Temp 98.1 F (36.7 C) (Oral)   Resp 17   Ht 5\' 6"  (1.676 m)   Wt 101.6 kg   LMP  (LMP Unknown)   BMI 36.15 kg/m  EFM: 140 bpm/good variability/accels present, no decels  CVE: Dilation: 3 Effacement (%): 80 Station: -2 Presentation: Vertex Exam by:: Delaine Canter MD   A&P: 31 y.o. G2P0010 [redacted]w[redacted]d here for IOL for gHTN. PEC labs normal. BP has been mildly elevated but not severe range.  #Labor: No cervical change from last check, continue pitocin. Consider AROM at next check in ~4 hours.  #Pain: IV fentanyl prn, considering epidural #FWB: category 1 #GBS positive- -penicillin #Anemia of pregnancy- 11.1 hgb on admission, on po iron  Cecilio Asper, [place] for [place] 8:57 PM

## 2022-02-25 ENCOUNTER — Inpatient Hospital Stay (HOSPITAL_COMMUNITY): Payer: Medicaid Other | Admitting: Anesthesiology

## 2022-02-25 ENCOUNTER — Encounter (HOSPITAL_COMMUNITY): Payer: Self-pay | Admitting: Obstetrics and Gynecology

## 2022-02-25 DIAGNOSIS — O99824 Streptococcus B carrier state complicating childbirth: Secondary | ICD-10-CM

## 2022-02-25 DIAGNOSIS — O134 Gestational [pregnancy-induced] hypertension without significant proteinuria, complicating childbirth: Secondary | ICD-10-CM

## 2022-02-25 DIAGNOSIS — O326XX1 Maternal care for compound presentation, fetus 1: Secondary | ICD-10-CM

## 2022-02-25 DIAGNOSIS — Z3A38 38 weeks gestation of pregnancy: Secondary | ICD-10-CM

## 2022-02-25 DIAGNOSIS — O139 Gestational [pregnancy-induced] hypertension without significant proteinuria, unspecified trimester: Secondary | ICD-10-CM

## 2022-02-25 LAB — CBC WITH DIFFERENTIAL/PLATELET
Abs Immature Granulocytes: 0.03 10*3/uL (ref 0.00–0.07)
Basophils Absolute: 0 10*3/uL (ref 0.0–0.1)
Basophils Relative: 0 %
Eosinophils Absolute: 0.1 10*3/uL (ref 0.0–0.5)
Eosinophils Relative: 1 %
HCT: 30.8 % — ABNORMAL LOW (ref 36.0–46.0)
Hemoglobin: 10.3 g/dL — ABNORMAL LOW (ref 12.0–15.0)
Immature Granulocytes: 0 %
Lymphocytes Relative: 28 %
Lymphs Abs: 2.7 10*3/uL (ref 0.7–4.0)
MCH: 26.1 pg (ref 26.0–34.0)
MCHC: 33.4 g/dL (ref 30.0–36.0)
MCV: 78.2 fL — ABNORMAL LOW (ref 80.0–100.0)
Monocytes Absolute: 0.7 10*3/uL (ref 0.1–1.0)
Monocytes Relative: 8 %
Neutro Abs: 5.9 10*3/uL (ref 1.7–7.7)
Neutrophils Relative %: 63 %
Platelets: 312 10*3/uL (ref 150–400)
RBC: 3.94 MIL/uL (ref 3.87–5.11)
RDW: 14.2 % (ref 11.5–15.5)
WBC: 9.5 10*3/uL (ref 4.0–10.5)
nRBC: 0 % (ref 0.0–0.2)

## 2022-02-25 LAB — RPR: RPR Ser Ql: NONREACTIVE

## 2022-02-25 MED ORDER — BENZOCAINE-MENTHOL 20-0.5 % EX AERO
1.0000 | INHALATION_SPRAY | CUTANEOUS | Status: DC | PRN
  Administered 2022-02-25: 1 via TOPICAL
  Filled 2022-02-25: qty 56

## 2022-02-25 MED ORDER — ZOLPIDEM TARTRATE 5 MG PO TABS: 5.0000 mg | ORAL_TABLET | Freq: Every evening | ORAL | Status: DC | PRN

## 2022-02-25 MED ORDER — FENTANYL-BUPIVACAINE-NACL 0.5-0.125-0.9 MG/250ML-% EP SOLN
12.0000 mL/h | EPIDURAL | Status: DC | PRN
  Administered 2022-02-25: 12 mL/h via EPIDURAL
  Filled 2022-02-25: qty 250

## 2022-02-25 MED ORDER — LIDOCAINE-EPINEPHRINE (PF) 2 %-1:200000 IJ SOLN
INTRAMUSCULAR | Status: DC | PRN
  Administered 2022-02-25: 5 mL via EPIDURAL

## 2022-02-25 MED ORDER — LACTATED RINGERS IV SOLN
500.0000 mL | Freq: Once | INTRAVENOUS | Status: AC
  Administered 2022-02-25: 500 mL via INTRAVENOUS

## 2022-02-25 MED ORDER — FENTANYL CITRATE (PF) 100 MCG/2ML IJ SOLN
100.0000 ug | INTRAMUSCULAR | Status: DC | PRN
  Administered 2022-02-25 (×2): 100 ug via INTRAVENOUS
  Filled 2022-02-25 (×2): qty 2

## 2022-02-25 MED ORDER — SIMETHICONE 80 MG PO CHEW: 80.0000 mg | CHEWABLE_TABLET | ORAL | Status: DC | PRN

## 2022-02-25 MED ORDER — DIBUCAINE (PERIANAL) 1 % EX OINT: 1.0000 | TOPICAL_OINTMENT | CUTANEOUS | Status: DC | PRN

## 2022-02-25 MED ORDER — ONDANSETRON HCL 4 MG/2ML IJ SOLN: 4.0000 mg | INTRAMUSCULAR | Status: DC | PRN

## 2022-02-25 MED ORDER — WITCH HAZEL-GLYCERIN EX PADS: 1.0000 | MEDICATED_PAD | CUTANEOUS | Status: DC | PRN

## 2022-02-25 MED ORDER — PRENATAL MULTIVITAMIN CH
1.0000 | ORAL_TABLET | Freq: Every day | ORAL | Status: DC
  Administered 2022-02-25 – 2022-02-26 (×2): 1 via ORAL
  Filled 2022-02-25 (×2): qty 1

## 2022-02-25 MED ORDER — OXYCODONE HCL 5 MG PO TABS: 5.0000 mg | ORAL_TABLET | ORAL | Status: DC | PRN

## 2022-02-25 MED ORDER — DIPHENHYDRAMINE HCL 25 MG PO CAPS: 25.0000 mg | ORAL_CAPSULE | Freq: Four times a day (QID) | ORAL | Status: DC | PRN

## 2022-02-25 MED ORDER — TETANUS-DIPHTH-ACELL PERTUSSIS 5-2.5-18.5 LF-MCG/0.5 IM SUSY: 0.5000 mL | PREFILLED_SYRINGE | Freq: Once | INTRAMUSCULAR | Status: DC

## 2022-02-25 MED ORDER — OXYCODONE HCL 5 MG PO TABS: 10.0000 mg | ORAL_TABLET | ORAL | Status: DC | PRN

## 2022-02-25 MED ORDER — ONDANSETRON HCL 4 MG PO TABS: 4.0000 mg | ORAL_TABLET | ORAL | Status: DC | PRN

## 2022-02-25 MED ORDER — COCONUT OIL OIL: 1.0000 | TOPICAL_OIL | Status: DC | PRN

## 2022-02-25 MED ORDER — PHENYLEPHRINE 80 MCG/ML (10ML) SYRINGE FOR IV PUSH (FOR BLOOD PRESSURE SUPPORT): 80.0000 ug | PREFILLED_SYRINGE | INTRAVENOUS | Status: DC | PRN

## 2022-02-25 MED ORDER — EPHEDRINE 5 MG/ML INJ: 10.0000 mg | INTRAVENOUS | Status: DC | PRN

## 2022-02-25 MED ORDER — ACETAMINOPHEN 325 MG PO TABS: 650.0000 mg | ORAL_TABLET | ORAL | Status: DC | PRN

## 2022-02-25 MED ORDER — SENNOSIDES-DOCUSATE SODIUM 8.6-50 MG PO TABS
2.0000 | ORAL_TABLET | Freq: Every day | ORAL | Status: DC
  Administered 2022-02-26: 2 via ORAL
  Filled 2022-02-25: qty 2

## 2022-02-25 MED ORDER — IBUPROFEN 600 MG PO TABS
600.0000 mg | ORAL_TABLET | Freq: Four times a day (QID) | ORAL | Status: DC
  Administered 2022-02-25 – 2022-02-26 (×4): 600 mg via ORAL
  Filled 2022-02-25 (×5): qty 1

## 2022-02-25 MED ORDER — DIPHENHYDRAMINE HCL 50 MG/ML IJ SOLN: 12.5000 mg | INTRAMUSCULAR | Status: DC | PRN

## 2022-02-25 NOTE — Lactation Note (Signed)
This note was copied from a baby's chart. Lactation Consultation Note  Patient Name: Latasha [name] [id number] Date: 02/25/2022 Reason for consult: Initial assessment;1st time breastfeeding;Early term 37-38.6wks;Breastfeeding assistance Age:32 hours Per mom the DM was not during  this pregnancy  LC offered to latch and mom receptive. Baby more awake after taking off the blankets, STS. Baby latched for a few sucks and released.  LC reviewed supply and demand, importance of giving the baby time to get hungry to latch.  Call with feeding cues for assistance.  LC reviewed BF Feeding goals , feed with feeding cues and by 3 hours if the baby isn't showing feeding cues to check diaper, change if needed and offer the breast.  Maternal Data Has patient been taught Hand Expression?: Yes Does the patient have breastfeeding experience prior to this delivery?: No  Feeding Mother's Current Feeding Choice: Breast Milk and Formula  LATCH Score Latch: Grasps breast easily, tongue down, lips flanged, rhythmical sucking.  Audible Swallowing: None  Type of Nipple: Everted at rest and after stimulation  Comfort (Breast/Nipple): Soft / non-tender  Hold (Positioning): Assistance needed to correctly position infant at breast and maintain latch.  LATCH Score: 7   Lactation Tools Discussed/Used    Interventions Interventions: Breast feeding basics reviewed;Assisted with latch;Skin to skin;Breast massage;Hand express;Reverse pressure;Breast compression;Adjust position;Support pillows;Education;LC Services brochure  Discharge Pump: Hands Free;Personal (per mom Medela)  Consult Status Consult Status: Follow-up Date: 02/25/22 Follow-up type: In-patient    [place] 02/25/2022, 1:08 PM

## 2022-02-25 NOTE — Anesthesia Preprocedure Evaluation (Signed)
Anesthesia Evaluation  Patient identified by MRN, date of birth, ID band Patient awake    Reviewed: Allergy & Precautions, NPO status , Patient's Chart, lab work & pertinent test results  Airway Mallampati: II  TM Distance: >3 FB Neck ROM: Full    Dental no notable dental hx.    Pulmonary asthma , Current SmokerPatient did not abstain from smoking.,    Pulmonary exam normal breath sounds clear to auscultation       Cardiovascular hypertension (preE), Normal cardiovascular exam Rhythm:Regular Rate:Normal     Neuro/Psych negative neurological ROS  negative psych ROS   GI/Hepatic negative GI ROS, Neg liver ROS,   Endo/Other  diabetes  Renal/GU negative Renal ROS  negative genitourinary   Musculoskeletal negative musculoskeletal ROS (+)   Abdominal   Peds  Hematology  (+) Blood dyscrasia, anemia ,   Anesthesia Other Findings IOL for gHTN  Reproductive/Obstetrics (+) Pregnancy                           Anesthesia Physical Anesthesia Plan  ASA: 3  Anesthesia Plan: Epidural   Post-op Pain Management:    Induction:   PONV Risk Score and Plan: Treatment may vary due to age or medical condition  Airway Management Planned: Natural Airway  Additional Equipment:   Intra-op Plan:   Post-operative Plan:   Informed Consent: I have reviewed the patients History and Physical, chart, labs and discussed the procedure including the risks, benefits and alternatives for the proposed anesthesia with the patient or authorized representative who has indicated his/her understanding and acceptance.       Plan Discussed with: Anesthesiologist  Anesthesia Plan Comments: (Patient identified. Risks, benefits, options discussed with patient including but not limited to bleeding, infection, nerve damage, paralysis, failed block, incomplete pain control, headache, blood pressure changes, nausea, vomiting,  reactions to medication, itching, and post partum back pain. Confirmed with bedside nurse the patient's most recent platelet count. Confirmed with the patient that they are not taking any anticoagulation, have any bleeding history or any family history of bleeding disorders. Patient expressed understanding and wishes to proceed. All questions were answered. )        Anesthesia Quick Evaluation

## 2022-02-25 NOTE — Anesthesia Procedure Notes (Signed)

## 2022-02-25 NOTE — Lactation Note (Addendum)
This note was copied from a baby's chart. Lactation Consultation Note  Patient Name: Latasha [name] [id number] Date: 02/25/2022 Reason for consult: Follow-up assessment;Primapara;Early term 37-38.6wks;Maternal endocrine disorder Age:32 hours Mom stated the baby last feeding was on the breast and done well. Baby sleeping. Mom swaddling him. Asked mom if she has any questions at this time mom stated no. Informed mom of LC hours tonight and if needs any assistance to call. Congratulated mom on the birth of her new baby.  Maternal Data    Feeding    LATCH Score                    Lactation Tools Discussed/Used    Interventions    Discharge    Consult Status Consult Status: Follow-up Date: 02/26/22 Follow-up type: In-patient    Theodoro Kalata 02/25/2022, 8:22 PM

## 2022-02-25 NOTE — Discharge Summary (Shared)
Postpartum Discharge Summary  Date of Service updated***     Patient Name: Latasha [name] DOB: 05-20-1990 MRN: [id number]  Date of admission: 02/24/2022 Delivery date:02/25/2022  Delivering provider: Concepcion Living  Date of discharge: 02/25/2022  Admitting diagnosis: Pre-eclampsia during pregnancy in third trimester, antepartum [O14.93] Intrauterine pregnancy: [redacted]w[redacted]d    Secondary diagnosis:  Principal Problem:   Gestational hypertension Active Problems:   Rubella non-immune status, antepartum   Encounter for supervision of normal pregnancy, antepartum   Anemia affecting pregnancy   Positive GBS test  Additional problems: none    Discharge diagnosis: Term Pregnancy Delivered and Gestational Hypertension                                              Post partum procedures:{Postpartum procedures:23558} Augmentation: Pitocin Complications: None  Hospital course: Induction of Labor With Vaginal Delivery   31y.o. yo G2P0010 at 360w5das admitted to the hospital 02/24/2022 for induction of labor.  Indication for induction: Gestational hypertension. PEC labs were normal and never had severe range BP. Patient had an labor course complicated by nothing.  Membrane Rupture Time/Date: 12:56 AM ,02/25/2022   Delivery Method:Vaginal, Spontaneous  Episiotomy: None  Lacerations:  Periurethral  Details of delivery can be found in separate delivery note.  Patient had a postpartum course complicated by***. Patient is discharged home 02/25/22.  Newborn Data: Birth date:02/25/2022  Birth time:5:18 AM  Gender:Female  Living status:Living  Apgars:9 ,9  Weight:   Magnesium Sulfate received: No BMZ received: No Rhophylac:{Rhophylac received:30440032} MMR:{MMR:30440033} T-DaP:{Tdap:23962} Flu: {F{XQJ:19417}ransfusion:{Transfusion received:30440034}  Physical exam  Vitals:   02/25/22 0530 02/25/22 0535 02/25/22 0550 02/25/22 0600  BP: 126/86  129/73 (!) 148/93  Pulse: (!) 137  97  85  Resp:      Temp:    (!) 97.4 F (36.3 C)  TempSrc:    Oral  SpO2: 100% 100%    Weight:      Height:       General: {Exam; general:21111117} Lochia: {Desc; appropriate/inappropriate:30686::"appropriate"} Uterine Fundus: {Desc; firm/soft:30687} Incision: {Exam; incision:21111123} DVT Evaluation: {Exam; dvt:2111122} Labs: Lab Results  Component Value Date   WBC 9.5 02/25/2022   HGB 10.3 (L) 02/25/2022   HCT 30.8 (L) 02/25/2022   MCV 78.2 (L) 02/25/2022   PLT 312 02/25/2022      Latest Ref Rng & Units 02/24/2022    2:06 PM  CMP  Glucose 70 - 99 mg/dL 121   BUN 6 - 20 mg/dL 7   Creatinine 0.44 - 1.00 mg/dL 0.79   Sodium 135 - 145 mmol/L 134   Potassium 3.5 - 5.1 mmol/L 3.5   Chloride 98 - 111 mmol/L 103   CO2 22 - 32 mmol/L 17   Calcium 8.9 - 10.3 mg/dL 9.0   Total Protein 6.5 - 8.1 g/dL 7.0   Total Bilirubin 0.3 - 1.2 mg/dL 0.2   Alkaline Phos 38 - 126 U/L 133   AST 15 - 41 U/L 18   ALT 0 - 44 U/L 10    Edinburgh Score:     No data to display           After visit meds:  Allergies as of 02/25/2022   No Known Allergies   Med Rec must be completed prior to [facility]        Discharge home in  stable condition Infant Feeding: {Baby feeding:23562} Infant Disposition:{CHL IP OB HOME WITH OHYWVP:71062} Discharge instruction: per After Visit Summary and Postpartum booklet. Activity: Advance as tolerated. Pelvic rest for 6 weeks.  Diet: {OB IRSW:54627035} Future Appointments: Future Appointments  Date Time Provider [place]  03/03/2022  8:50 AM Roma Schanz, CNM [facility]  03/10/2022  8:30 AM Cresenzo-Dishmon, Joaquim Lai, CNM [facility]   Follow up Visit:  Message sent 02/25/22  Please schedule this patient for a In person postpartum visit in 4 weeks with the following provider: MD. Additional Postpartum F/U:BP check 1 week  High risk pregnancy complicated by: HTN Delivery mode:  Vaginal, Spontaneous   Anticipated Birth Control:  PP Nexplanon placed   02/25/2022 Cecilio Asper, MD

## 2022-02-25 NOTE — Progress Notes (Signed)
Labor Progress Note CLOVIA REINE is a 31 y.o. G2P0010 at [redacted]w[redacted]d presented for IOL for gHTN.  S: Patient is resting comfortably. Feeling more painful contractions now.    O:  BP 125/85   Pulse 88   Temp 97.9 F (36.6 C) (Oral)   Resp 17   Ht 5\' 6"  (1.676 m)   Wt 101.6 kg   LMP  (LMP Unknown)   BMI 36.15 kg/m  EFM: 125 bpm/good variability/no accels or decels  CVE: Dilation: 4 Effacement (%): 80 Station: -1 Presentation: Vertex Exam by:: Smalley RN   A&P: 31 y.o. G2P0010 [redacted]w[redacted]d here for IOL for gHTN. PEC labs normal. BP has been mildly elevated but not severe range.  #Labor: SROM 0056 w/ clear fluid. Cervix making small change. Continue pitocin augmentation.  #Pain: IV fentanyl prn, considering epidural #FWB: category 1 #GBS positive- -penicillin #Anemia of pregnancy- 11.1 hgb on admission, on po iron  Cecilio Asper, [place] for [place] 1:09 AM

## 2022-02-25 NOTE — Anesthesia Postprocedure Evaluation (Signed)
Anesthesia Post Note  Patient: Latasha [name]  Procedure(s) Performed: AN AD HOC LABOR EPIDURAL     Patient location during evaluation: Mother Baby Anesthesia Type: Epidural Level of consciousness: awake and alert Pain management: pain level controlled Vital Signs Assessment: post-procedure vital signs reviewed and stable Respiratory status: spontaneous breathing, nonlabored ventilation and respiratory function stable Cardiovascular status: stable Postop Assessment: no headache, no backache and epidural receding Anesthetic complications: no   No notable events documented.  Last Vitals:  Vitals:   02/25/22 0834 02/25/22 0918  BP: 122/79 127/78  Pulse: 71 62  Resp: 16 18  Temp: 36.9 C 37.1 C  SpO2: 100% 99%    Last Pain:  Vitals:   02/25/22 0918  TempSrc: Oral  PainSc: 0-No pain   Pain Goal:                   Edwyna Dangerfield

## 2022-02-26 MED ORDER — FUROSEMIDE 20 MG PO TABS: 20.0000 mg | ORAL_TABLET | Freq: Every day | ORAL | 0 refills | Status: DC

## 2022-02-26 MED ORDER — ALBUTEROL SULFATE (2.5 MG/3ML) 0.083% IN NEBU: 3.0000 mL | INHALATION_SOLUTION | Freq: Four times a day (QID) | RESPIRATORY_TRACT | Status: DC | PRN

## 2022-02-26 MED ORDER — IBUPROFEN 600 MG PO TABS: 600.0000 mg | ORAL_TABLET | Freq: Four times a day (QID) | ORAL | 0 refills | Status: DC

## 2022-02-26 NOTE — Lactation Note (Signed)
This note was copied from a baby's chart. Lactation Consultation Note  Patient Name: Latasha [name] [id number]'M Date: 02/26/2022 Reason for consult: Follow-up assessment;1st time breastfeeding Age:32 hours  P1, Baby out of room for circumcision. Reviewed hand pump use and suggest mother call for LC to view next feeding. Feed on demand with cues.  Goal 8-12+ times per day after first 24 hrs.  Place baby STS if not cueing.  Provided hand pump and reviewed use.   Maternal Data Does the patient have breastfeeding experience prior to this delivery?: No  Feeding Mother's Current Feeding Choice: Breast Milk and Formula Nipple Type: Extra Slow Flow   Lactation Tools Discussed/Used Tools: Pump Breast pump type: Manual  Interventions Interventions: Education;Hand pump  Discharge Discharge Education: Engorgement and breast care;Warning signs for feeding baby  Consult Status Consult Status: Follow-up Date: 02/26/22 Follow-up type: In-patient    [name] [facility] 02/26/2022, 10:02 AM

## 2022-03-03 ENCOUNTER — Encounter: Payer: Medicaid Other | Admitting: Women's Health

## 2022-03-04 ENCOUNTER — Ambulatory Visit (INDEPENDENT_AMBULATORY_CARE_PROVIDER_SITE_OTHER): Payer: Medicaid Other | Admitting: *Deleted

## 2022-03-04 ENCOUNTER — Encounter: Payer: Self-pay | Admitting: *Deleted

## 2022-03-04 VITALS — BP 155/95 | HR 64 | Ht 66.0 in | Wt 220.0 lb

## 2022-03-04 DIAGNOSIS — O165 Unspecified maternal hypertension, complicating the puerperium: Secondary | ICD-10-CM | POA: Insufficient documentation

## 2022-03-04 DIAGNOSIS — Z0130 Encounter for examination of blood pressure without abnormal findings: Secondary | ICD-10-CM

## 2022-03-04 MED ORDER — AMLODIPINE BESYLATE 5 MG PO TABS: 5.0000 mg | ORAL_TABLET | Freq: Every day | ORAL | 1 refills | Status: DC

## 2022-03-04 NOTE — Progress Notes (Signed)
   NURSE VISIT- BLOOD PRESSURE CHECK  SUBJECTIVE:  Latasha [name] is a 31 y.o. G20P1011 female here for BP check. She is postpartum, delivery date 02/25/22.     HYPERTENSION ROS:  Pregnant/postpartum:  Severe headaches that don't go away with tylenol/other medicines: No . Has had a headache but wouldn't call it severe. Didn't take any med for it.  Visual changes (seeing spots/double/blurred vision) No  Severe pain under right breast breast or in center of upper chest No  Severe nausea/vomiting No  Taking medicines as instructed not applicable   OBJECTIVE:  BP (!) 142/95 (BP Location: Left Arm, Patient Position: Sitting, Cuff Size: Normal)   Pulse 66   Ht 5\' 6"  (1.676 m)   Wt 220 lb (99.8 kg)   Breastfeeding Yes   BMI 35.51 kg/m  BP after pt sat a few minutes was 155/95 pulse 64.  Appearance alert, well appearing, and in no distress.  ASSESSMENT: Postpartum  blood pressure check  PLAN: Discussed with Knute Neu, CNM, [facility]   Recommendations: new prescription will be sent   Follow-up: as scheduled, Friday for nurse BP check.    Levy Pupa  03/04/2022 3:18 PM

## 2022-03-04 NOTE — Addendum Note (Signed)
Addended by: Roma Schanz on: 03/04/2022 03:37 PM   Modules accepted: Orders

## 2022-03-07 ENCOUNTER — Ambulatory Visit (INDEPENDENT_AMBULATORY_CARE_PROVIDER_SITE_OTHER): Payer: Medicaid Other | Admitting: *Deleted

## 2022-03-07 VITALS — BP 141/93 | HR 61

## 2022-03-07 DIAGNOSIS — O165 Unspecified maternal hypertension, complicating the puerperium: Secondary | ICD-10-CM

## 2022-03-07 NOTE — Progress Notes (Signed)
   NURSE VISIT- BLOOD PRESSURE CHECK  SUBJECTIVE:  Latasha [name] is a 31 y.o. G41P1011 female here for BP check. She is postpartum, delivery date 02/25/22     HYPERTENSION ROS:  Postpartum:  Severe headaches that don't go away with tylenol/other medicines:  mild headache but does not want to take Tylenol Visual changes (seeing spots/double/blurred vision) No  Severe pain under right breast breast or in center of upper chest No  Severe nausea/vomiting No  Taking medicines as instructed yes  OBJECTIVE:  BP (!) 141/93 (BP Location: Right Arm, Patient Position: Sitting, Cuff Size: Normal)   Pulse 61   Breastfeeding Yes   Appearance alert, well appearing, and in no distress and oriented to person, place, and time.  ASSESSMENT: Postpartum  blood pressure check  PLAN: Discussed with Dr. Despina Hidden   Recommendations:  Increase Norvasc to 10mg  daily. New script to be sent in     Follow-up: as scheduled    03/07/2022 9:31 AM

## 2022-03-10 ENCOUNTER — Encounter: Payer: Medicaid Other | Admitting: Advanced Practice Midwife

## 2022-03-25 ENCOUNTER — Encounter: Payer: Self-pay | Admitting: Women's Health

## 2022-03-25 ENCOUNTER — Ambulatory Visit (INDEPENDENT_AMBULATORY_CARE_PROVIDER_SITE_OTHER): Payer: Medicaid Other | Admitting: Women's Health

## 2022-03-25 DIAGNOSIS — Z30017 Encounter for initial prescription of implantable subdermal contraceptive: Secondary | ICD-10-CM

## 2022-03-25 DIAGNOSIS — O165 Unspecified maternal hypertension, complicating the puerperium: Secondary | ICD-10-CM

## 2022-03-25 MED ORDER — HYDROCHLOROTHIAZIDE 25 MG PO TABS: 25.0000 mg | ORAL_TABLET | Freq: Every day | ORAL | 0 refills | Status: DC

## 2022-03-25 MED ORDER — ETONOGESTREL 68 MG ~~LOC~~ IMPL
68.0000 mg | DRUG_IMPLANT | Freq: Once | SUBCUTANEOUS | Status: AC
  Administered 2022-03-25: 68 mg via SUBCUTANEOUS

## 2022-03-25 NOTE — Patient Instructions (Signed)
Keep the area clean and dry.  You can remove the big bandage in 24 hours, and the small steri-strip bandage in 3-5 days.  A back up method, such as condoms, should be used for two weeks. You may have irregular vaginal bleeding for the first 6 months after the Nexplanon is placed, then the bleeding usually lightens and it is possible that you may not have any periods.  If you have any concerns, please give us a call.    Etonogestrel Implant What is this medication? ETONOGESTREL (et oh noe JES trel) prevents ovulation and pregnancy. It belongs to a group of medications called contraceptives. This medication is a progestin hormone. This medicine may be used for other purposes; ask your health care provider or pharmacist if you have questions. COMMON BRAND NAME(S): Implanon, Nexplanon What should I tell my care team before I take this medication? They need to know if you have any of these conditions: Abnormal vaginal bleeding Blood clots Blood vessel disease Breast, cervical, endometrial, ovarian, liver, or uterine cancer Diabetes Gallbladder disease Heart disease or recent heart attack High blood pressure High cholesterol or triglycerides Kidney disease Liver disease Migraine headaches Seizures Stroke Tobacco use An unusual or allergic reaction to etonogestrel, other medications, foods, dyes, or preservatives Pregnant or trying to get pregnant Breastfeeding How should I use this medication? This device is inserted just under the skin on the inner side of your upper arm by your care team. Talk to your care team about the use of this medication in children. Special care may be needed. Overdosage: If you think you have taken too much of this medicine contact a poison control center or emergency room at once. NOTE: This medicine is only for you. Do not share this medicine with others. What if I miss a dose? This does not apply. What may interact with this medication? Do not take this  medication with any of the following: Amprenavir Fosamprenavir This medication may also interact with the following: Acitretin Aprepitant Armodafinil Bexarotene Bosentan Carbamazepine Certain antivirals for HIV or hepatitis Certain medications for fungal infections, such as fluconazole, ketoconazole, itraconazole, or voriconazole Cyclosporine Felbamate Griseofulvin Lamotrigine Modafinil Oxcarbazepine Phenobarbital Phenytoin Primidone Rifabutin Rifampin Rifapentine St. John's wort Topiramate This list may not describe all possible interactions. Give your health care provider a list of all the medicines, herbs, non-prescription drugs, or dietary supplements you use. Also tell them if you smoke, drink alcohol, or use illegal drugs. Some items may interact with your medicine. What should I watch for while using this medication? Visit your care team for regular checks on your progress. Using this medication does not protect you or your partner against HIV or other sexually transmitted infections (STIs). You should be able to feel the implant by pressing your fingertips over the skin where it was inserted. Contact your care team if you cannot feel the implant, and use a non-hormonal birth control method (such as condoms) until your care team confirms that the implant is in place. Contact your care team if you think that the implant may have broken or become bent while in your arm. You will receive a user card from your care team after the implant is inserted. The card is a record of the location of the implant in your upper arm and when it should be removed. Keep this card with your health records. What side effects may I notice from receiving this medication? Side effects that you should report to your care team as soon as   possible: Allergic reactions--skin rash, itching, hives, swelling of the face, lips, tongue, or throat Blood clot--pain, swelling, or warmth in the leg, shortness of  breath, chest pain Gallbladder problems--severe stomach pain, nausea, vomiting, fever Increase in blood pressure Liver injury--right upper belly pain, loss of appetite, nausea, light-colored stool, dark yellow or brown urine, yellowing skin or eyes, unusual weakness or fatigue New or worsening migraines or headaches Pain, redness, or irritation at injection site Stroke--sudden numbness or weakness of the face, arm, or leg, trouble speaking, confusion, trouble walking, loss of balance or coordination, dizziness, severe headache, change in vision Unusual vaginal discharge, itching, or odor Worsening mood, feelings of depression Side effects that usually do not require medical attention (report to your care team if they continue or are bothersome): Breast pain or tenderness Dark patches of skin on the face or other sun-exposed areas Irregular menstrual cycles or spotting Nausea Weight gain This list may not describe all possible side effects. Call your doctor for medical advice about side effects. You may report side effects to FDA at 1-800-FDA-1088. Where should I keep my medication? This medication is given in a hospital or clinic and will not be stored at home. NOTE: This sheet is a summary. It may not cover all possible information. If you have questions about this medicine, talk to your doctor, pharmacist, or health care provider.  2023 Elsevier/Gold Standard (2021-11-19 00:00:00)  

## 2022-03-25 NOTE — Progress Notes (Signed)
POSTPARTUM VISIT Patient name: Latasha [name] MRN [id number]  Date of birth: 1990-12-16 Chief Complaint:   Postpartum Care  History of Present Illness:   DARLEAN [name] is a 31 y.o. G51P1011 African American female being seen today for a postpartum visit. She is 4 weeks postpartum following a spontaneous vaginal delivery at 38.5 gestational weeks. IOL: yes, for gestational hypertension . Anesthesia: epidural.  Laceration: Rt periurethral and supraclitoral-hemostatic, not repaired.  Complications: none. Inpatient contraception: no.   Pregnancy complicated by [facility] . Tobacco use: former . Substance use disorder: no. Last pap smear: 09/26/21 and results were NILM w/ HRHPV negative. Next pap smear due: 2026 No LMP recorded.  Postpartum course has been complicated by PPHTN, currently on norvasc 2m, took @ ~ 0930 this am, was rushing to get here this am . Bleeding none. Bowel function is normal. Bladder function is normal. Urinary incontinence? no, fecal incontinence? no Patient is not sexually active. Last sexual activity: prior to birth of baby. Desired contraception: Nexplanon. Patient does want a pregnancy in the future.  Desired family size is 2 children.   Upstream - 03/25/22 1144       Pregnancy Intention Screening   Does the patient want to become pregnant in the next year? No    Does the patient's partner want to become pregnant in the next year? No    Would the patient like to discuss contraceptive options today? Yes      Contraception Wrap Up   Current Method Abstinence    Contraception Counseling Provided Yes            The pregnancy intention screening data noted above was reviewed. Potential methods of contraception were discussed. The patient elected to proceed with No data recorded.  Edinburgh Postpartum Depression Screening: negative  Edinburgh Postnatal Depression Scale - 03/25/22 1145       Edinburgh Postnatal Depression Scale:  In the Past 7 Days   I have  been able to laugh and see the funny side of things. 0    I have looked forward with enjoyment to things. 0    I have blamed myself unnecessarily when things went wrong. 1    I have been anxious or worried for no good reason. 0    I have felt scared or panicky for no good reason. 0    Things have been getting on top of me. 0    I have been so unhappy that I have had difficulty sleeping. 0    I have felt sad or miserable. 0    I have been so unhappy that I have been crying. 0    The thought of harming myself has occurred to me. 0    Edinburgh Postnatal Depression Scale Total 1                08/26/2021    9:32 AM 10/20/2019    8:42 AM  GAD 7 : Generalized Anxiety Score  Nervous, Anxious, on Edge 0 0  Control/stop worrying 0 0  Worry too much - different things 0 1  Trouble relaxing 0 1  Restless 0 0  Easily annoyed or irritable 0 1  Afraid - awful might happen 0 0  Total GAD 7 Score 0 3  Anxiety Difficulty  Not difficult at all     Baby's course has been uncomplicated. Baby is feeding by bottle. Infant has a pediatrician/family doctor? Yes.  Childcare strategy if returning to work/school: n/a-stay at home mom.  Pt has material needs met for her and baby: Yes.   Review of Systems:   Pertinent items are noted in HPI Denies Abnormal vaginal discharge w/ itching/odor/irritation, headaches, visual changes, shortness of breath, chest pain, abdominal pain, severe nausea/vomiting, or problems with urination or bowel movements. Pertinent History Reviewed:  Reviewed past medical,surgical, obstetrical and family history.  Reviewed problem list, medications and allergies. OB History  Gravida Para Term Preterm AB Living  _0 SAB IAB Ectopic Multiple Live Births  1     0 1    # Outcome Date GA Lbr Len/2nd Weight Sex Delivery Anes PTL Lv  2 Term 02/25/22 [id number] / 00:06 6 lb 10.9 oz (3.03 kg) M Vag-Spont EPI  LIV  1 SAB            Physical Assessment:   Vitals:    03/25/22 1136 03/25/22 1143 03/25/22 1226  BP: (!) 159/114 (!) 142/99 (!) 151/94  Pulse: 78 78   Weight: 214 lb 6 oz (97.2 kg)    Height: _1  (1.676 m)    Body mass index is 34.6 kg/m.       Physical Examination:   General appearance: alert, well appearing, and in no distress  Mental status: alert, oriented to person, place, and time  Skin: warm & dry   Cardiovascular: normal heart rate noted   Respiratory: normal respiratory effort, no distress   Breasts: deferred, no complaints   Abdomen: soft, non-tender   Pelvic: examination not indicated. Thin prep pap obtained: No  Rectal: not examined  Extremities: Edema: none   Chaperone: N/A         No results found for this or any previous visit (from the past 24 hour(s)).   NEXPLANON INSERTION  Risks/benefits/side effects of Nexplanon have been discussed and her questions have been answered.  Specifically, a failure rate of 04/998 has been reported, with an increased failure rate if pt takes SVidorand/or antiseizure medicaitons.  She is aware of the common side effect of irregular bleeding, which the incidence of decreases over time. Signed copy of informed consent in chart.   Time out was performed.  She is right-handed, so her left arm, approximately 10cm from the medial epicondyle and 3-5cm posterior to the sulcus, was cleansed with alcohol and anesthetized with 2cc of 2% Lidocaine.  The area was cleansed again with betadine and the Nexplanon was inserted per manufacturer's recommendations without difficulty.  3 steri-strips and pressure bandage were applied. The patient tolerated the procedure well.   Assessment & Plan:  1) Postpartum exam 2) 4 wks s/p spontaneous vaginal delivery after IOL for GHTN 3) bottle feeding 4) Depression screening 5) Nexplanon insertion Pt was instructed to keep the area clean and dry, remove pressure bandage in 24 hours, and keep insertion site covered with the steri-strip for 3-5 days.   Condoms for 2 weeks.  She was given a card indicating date Nexplanon was inserted and date it needs to be removed. Follow-up PRN problems. 6) PPHTN> not well controlled on norvasc 167m will add hctz 2541mf/u on Friday for bp check w/ nurse  Essential components of care per ACOG recommendations:  1.  Mood and well being:  If positive depression screen, discussed and plan developed.  If using tobacco we discussed reduction/cessation and risk of relapse If current substance abuse, we discussed and referral to local resources was offered.   2. Infant care and feeding:  If  breastfeeding, discussed returning to work, pumping, breastfeeding-associated pain, guidance regarding return to fertility while lactating if not using another method. If needed, patient was provided with a letter to be allowed to pump q 2-3hrs to support lactation in a private location with access to a refrigerator to store breastmilk.   Recommended that all caregivers be immunized for flu, pertussis and other preventable communicable diseases If pt does not have material needs met for her/baby, referred to local resources for help obtaining these.  3. Sexuality, contraception and birth spacing Provided guidance regarding sexuality, management of dyspareunia, and resumption of intercourse Discussed avoiding interpregnancy interval <59mhs and recommended birth spacing of 18 months  4. Sleep and fatigue Discussed coping options for fatigue and sleep disruption Encouraged family/partner/community support of 4 hrs of uninterrupted sleep to help with mood and fatigue  5. Physical recovery  If pt had a C/S, assessed incisional pain and providing guidance on normal vs prolonged recovery If pt had a laceration, perineal healing and pain reviewed.  If urinary or fecal incontinence, discussed management and referred to PT or uro/gyn if indicated  Patient is safe to resume physical activity. Discussed attainment of healthy  weight.  6.  Chronic disease management Discussed pregnancy complications if any, and their implications for future childbearing and long-term maternal health. Review recommendations for prevention of recurrent pregnancy complications, such as 17 hydroxyprogesterone caproate to reduce risk for recurrent PTB not applicable, or aspirin to reduce risk of preeclampsia yes. Pt had GDM: No. If yes, 2hr GTT scheduled: not applicable. Reviewed medications and non-pregnant dosing including consideration of whether pt is breastfeeding using a reliable resource such as LactMed: not applicable Referred for f/u w/ PCP or subspecialist providers as indicated: not applicable  7. Health maintenance Mammogram at [id number] earlier if indicated Pap smears as indicated  Meds:  Meds ordered this encounter  Medications   hydrochlorothiazide (HYDRODIURIL) 25 MG tablet    Sig: Take 1 tablet (25 mg total) by mouth daily.    Dispense:  30 tablet    Refill:  0    Follow-up: No follow-ups on file.   No orders of the defined types were placed in this encounter.   [facility]11/28/2023 12:35 PM

## 2022-03-28 ENCOUNTER — Ambulatory Visit (INDEPENDENT_AMBULATORY_CARE_PROVIDER_SITE_OTHER): Payer: Medicaid Other

## 2022-03-28 VITALS — BP 144/95 | HR 76 | Ht 66.0 in | Wt 217.0 lb

## 2022-03-28 DIAGNOSIS — Z0130 Encounter for examination of blood pressure without abnormal findings: Secondary | ICD-10-CM

## 2022-03-28 NOTE — Progress Notes (Signed)
   NURSE VISIT- BLOOD PRESSURE CHECK  SUBJECTIVE:  Latasha [name] is a 31 y.o. G44P1011 female here for BP check. She is postpartum, delivery date 02/25/2022     HYPERTENSION ROS:  Pregnant/postpartum:  Severe headaches that don't go away with tylenol/other medicines: No  Visual changes (seeing spots/double/blurred vision) No  Severe pain under right breast breast or in center of upper chest No  Severe nausea/vomiting No  Taking medicines as instructed yes   OBJECTIVE:  BP (!) 144/95 (BP Location: Right Arm, Patient Position: Sitting, Cuff Size: Normal)   Pulse 76   Ht 5\' 6"  (1.676 m)   Wt 217 lb (98.4 kg)   Breastfeeding No   BMI 35.02 kg/m   Appearance alert, well appearing, and in no distress.  ASSESSMENT: Postpartum  blood pressure check  PLAN: Discussed with Dr.   Recommendations: no changes needed, Continue taking HCT 25 mg daily  Follow-up: in 3 months with PCP  Latasha [name]  03/28/2022 11:17 AM

## 2022-06-04 ENCOUNTER — Ambulatory Visit: Payer: Medicaid Other | Admitting: Family Medicine

## 2022-08-18 ENCOUNTER — Ambulatory Visit (INDEPENDENT_AMBULATORY_CARE_PROVIDER_SITE_OTHER): Payer: Medicaid Other | Admitting: Family Medicine

## 2022-08-18 VITALS — BP 137/89 | HR 85 | Temp 97.5°F | Ht 66.0 in | Wt 245.0 lb

## 2022-08-18 DIAGNOSIS — I10 Essential (primary) hypertension: Secondary | ICD-10-CM

## 2022-08-18 DIAGNOSIS — Z862 Personal history of diseases of the blood and blood-forming organs and certain disorders involving the immune mechanism: Secondary | ICD-10-CM

## 2022-08-18 DIAGNOSIS — Z13220 Encounter for screening for lipoid disorders: Secondary | ICD-10-CM

## 2022-08-18 DIAGNOSIS — Z8639 Personal history of other endocrine, nutritional and metabolic disease: Secondary | ICD-10-CM

## 2022-08-18 MED ORDER — HYDROCHLOROTHIAZIDE 25 MG PO TABS: 25.0000 mg | ORAL_TABLET | Freq: Every day | ORAL | 1 refills | Status: DC

## 2022-08-18 NOTE — Patient Instructions (Signed)
Continue the HCTZ. Watch BP. If remains high will add Labetalol.  Take care  Dr. Adriana Simas

## 2022-08-19 DIAGNOSIS — I10 Essential (primary) hypertension: Secondary | ICD-10-CM | POA: Insufficient documentation

## 2022-08-19 NOTE — Progress Notes (Signed)
Subjective:  Patient ID: Latasha [name], female    DOB: January 30, 1991  Age: 32 y.o. MRN: [id number]  CC: Chief Complaint  Patient presents with   Establish Care    Elevated BP readings in the last 6 months since child birth Not taking HCTZ due to no refills    HPI:  32 year old female presents to establish care.   Patient reports that overall she is doing well.  However, she is concerned about her blood pressure.  She is currently taking HCTZ 25 mg daily.  She states that she has had some home blood pressure readings which have been elevated.  Patient reports that she plans on removing her Nexplanon in the near future and wants to try and conceive again.  Patient Active Problem List   Diagnosis Date Noted   Essential hypertension 08/19/2022   Nexplanon insertion 03/25/2022   Anemia affecting pregnancy 12/01/2021   Alpha thalassemia silent carrier 09/10/2021   History of pancreatitis 08/26/2021   Dysplasia of cervix, unspecified 04/12/2013   History of diabetes mellitus 04/12/2013   Asthma 03/07/2013   Rubella non-immune status, antepartum 02/23/2013    Social Hx   Social History   Socioeconomic History   Marital status: Married    Spouse name: Not on file   Number of children: Not on file   Years of education: Not on file   Highest education level: Not on file  Occupational History   Not on file  Tobacco Use   Smoking status: Former    Types: Cigars   Smokeless tobacco: Never  Vaping Use   Vaping Use: Never used  Substance and Sexual Activity   Alcohol use: No    Alcohol/week: 0.0 standard drinks of alcohol   Drug use: No    Types: Marijuana    Comment: not now   Sexual activity: Not Currently    Birth control/protection: None  Other Topics Concern   Not on file  Social History Narrative   Not on file   Social Determinants of Health   Financial Resource Strain: Low Risk  (08/26/2021)   Overall Financial Resource Strain (CARDIA)    Difficulty of Paying  Living Expenses: Not very hard  Food Insecurity: No Food Insecurity (02/24/2022)   Hunger Vital Sign    Worried About Running Out of Food in the Last Year: Never true    Ran Out of Food in the Last Year: Never true  Transportation Needs: No Transportation Needs (02/24/2022)   PRAPARE - Administrator, Civil Service (Medical): No    Lack of Transportation (Non-Medical): No  Physical Activity: Sufficiently Active (08/26/2021)   Exercise Vital Sign    Days of Exercise per Week: 6 days    Minutes of Exercise per Session: 40 min  Stress: No Stress Concern Present (08/26/2021)   [employer] of Occupational Health - Occupational Stress Questionnaire    Feeling of Stress : Not at all  Social Connections: Moderately Integrated (08/26/2021)   Social Connection and Isolation Panel [NHANES]    Frequency of Communication with Friends and Family: More than three times a week    Frequency of Social Gatherings with Friends and Family: Twice a week    Attends Religious Services: More than 4 times per year    Active Member of [employer] or Organizations: No    Attends Banker Meetings: Never    Marital Status: Married    Review of Systems  Constitutional: Negative.   Respiratory: Negative.  Cardiovascular: Negative.     Objective:  BP 137/89   Pulse 85   Temp (!) 97.5 F (36.4 C)   Ht  (1.676 m)   Wt 245 lb (111.1 kg)   SpO2 99%   BMI 39.54 kg/m      08/18/2022    2:08 PM 03/28/2022   11:09 AM 03/28/2022   11:06 AM  BP/Weight  Systolic BP 137 144 158  Diastolic BP 89 95 95  Wt. (Lbs) 245  217  BMI 39.54 kg/m2  35.02 kg/m2    Physical Exam Vitals and nursing note reviewed.  Constitutional:      Appearance: Normal appearance. She is obese.  HENT:     Head: Normocephalic and atraumatic.  Cardiovascular:     Rate and Rhythm: Normal rate and regular rhythm.  Pulmonary:     Effort: Pulmonary effort is normal.     Breath sounds: Normal breath sounds. No  wheezing, rhonchi or rales.  Neurological:     Mental Status: She is alert.  Psychiatric:        Mood and Affect: Mood normal.        Behavior: Behavior normal.     Lab Results  Component Value Date   WBC 9.5 02/25/2022   HGB 10.3 (L) 02/25/2022   HCT 30.8 (L) 02/25/2022   PLT 312 02/25/2022   GLUCOSE 121 (H) 02/24/2022   ALT 10 02/24/2022   AST 18 02/24/2022   NA 134 (L) 02/24/2022   K 3.5 02/24/2022   CL 103 02/24/2022   CREATININE 0.79 02/24/2022   BUN 7 02/24/2022   CO2 17 (L) 02/24/2022   TSH 0.753 02/22/2013   HGBA1C 5.8 (H) 08/26/2021     Assessment & Plan:   Problem List Items Addressed This Visit       Cardiovascular and Mediastinum   Essential hypertension - Primary    Stable currently. Discussed medications with OB GYN (Dr. Despina Hidden). Continuing HCTZ. Refilled today.       Relevant Medications   hydrochlorothiazide (HYDRODIURIL) 25 MG tablet     Other   History of diabetes mellitus   Relevant Orders   CMP14+EGFR   Hemoglobin A1c   Other Visit Diagnoses     History of anemia       Relevant Orders   CBC   Screening, lipid       Relevant Orders   Lipid panel       Meds ordered this encounter  Medications   hydrochlorothiazide (HYDRODIURIL) 25 MG tablet    Sig: Take 1 tablet (25 mg total) by mouth daily.    Dispense:  90 tablet    Refill:  1    Follow-up:  Return in about 3 months (around 11/17/2022).  Everlene Other DO [facility]

## 2022-08-19 NOTE — Assessment & Plan Note (Signed)
Stable currently. Discussed medications with OB GYN (Dr. Despina Hidden). Continuing HCTZ. Refilled today.

## 2022-09-15 ENCOUNTER — Encounter: Payer: Self-pay | Admitting: Women's Health

## 2022-09-15 ENCOUNTER — Ambulatory Visit (INDEPENDENT_AMBULATORY_CARE_PROVIDER_SITE_OTHER): Payer: Medicaid Other | Admitting: Women's Health

## 2022-09-15 VITALS — BP 152/85 | HR 69 | Ht 66.0 in | Wt 251.0 lb

## 2022-09-15 DIAGNOSIS — I10 Essential (primary) hypertension: Secondary | ICD-10-CM

## 2022-09-15 DIAGNOSIS — Z319 Encounter for procreative management, unspecified: Secondary | ICD-10-CM

## 2022-09-15 DIAGNOSIS — Z3046 Encounter for surveillance of implantable subdermal contraceptive: Secondary | ICD-10-CM

## 2022-09-15 NOTE — Patient Instructions (Addendum)
Keep the area clean and dry.  You can remove the big bandage in 24 hours, and the small steri-strip bandage in 3-5 days.    Use condoms until bp controlled  Take bp daily, send log to Dr. Adriana Simas in 1wk

## 2022-09-15 NOTE — Progress Notes (Signed)
NEXPLANON REMOVAL Patient name: Latasha [name] MRN [id number]  Date of birth: October 25, 1990 Subjective Findings:   Latasha [name] is a 32 y.o. G34P1011 African American female being seen today for removal of a Nexplanon. Her Nexplanon was placed 03/25/22.  She desires removal because she wants to get pregnant again. Signed copy of informed consent in chart.  Has CHTN on HCTZ 25mg  daily, last saw PCP 08/18/22. Hasn't checked home bp in about 1.5wks. Reports today she is a little nervous about Nexplanon removal.   No LMP recorded. Last pap6/1/23. Results were: NILM w/ HRHPV negative The planned method of family planning is none     08/18/2022    2:18 PM 08/26/2021    9:32 AM 10/20/2019    8:41 AM 03/27/2017   10:10 AM 03/25/2016    9:25 AM  Depression screen PHQ 2/9  Decreased Interest 0 0 0 0 0  Down, Depressed, Hopeless 0 0 1 0 0  PHQ - 2 Score 0 0 1 0 0  Altered sleeping 1 0 1    Tired, decreased energy 0 0 2    Change in appetite 0 0 2    Feeling bad or failure about yourself  0 0 0    Trouble concentrating 0 0 0    Moving slowly or fidgety/restless 0 0 0    Suicidal thoughts 0 0 0    PHQ-9 Score 1 0 6    Difficult doing work/chores Not difficult at all  Somewhat difficult          08/18/2022    2:18 PM 08/26/2021    9:32 AM 10/20/2019    8:42 AM  GAD 7 : Generalized Anxiety Score  Nervous, Anxious, on Edge 0 0 0  Control/stop worrying 0 0 0  Worry too much - different things 0 0 1  Trouble relaxing 0 0 1  Restless 0 0 0  Easily annoyed or irritable 0 0 1  Afraid - awful might happen 0 0 0  Total GAD 7 Score 0 0 3  Anxiety Difficulty Not difficult at all  Not difficult at all     Pertinent History Reviewed:   Reviewed past medical,surgical, social, obstetrical and family history.  Reviewed problem list, medications and allergies. Objective Findings & Procedure:    Vitals:   09/15/22 0839 09/15/22 0900  BP: (!) 155/102 (!) 152/85  Pulse: 73 69  Weight: 251 lb  (113.9 kg)   Height: 5\' 6"  (1.676 m)   Body mass index is 40.51 kg/m.  No results found for this or any previous visit (from the past 24 hour(s)).   Time out was performed.  Nexplanon site identified.  Area prepped in usual sterile fashon. One cc of 2% lidocaine was used to anesthetize the area at the distal end of the implant. A small stab incision was made right beside the implant on the distal portion.  The Nexplanon rod was grasped using hemostats and removed without difficulty.  There was less than 3 cc blood loss. There were no complications.  Steri-strips were applied over the small incision and a pressure bandage was applied.  The patient tolerated the procedure well. Assessment & Plan:   1) Nexplanon removal She was instructed to keep the area clean and dry, remove pressure bandage in 24 hours, and keep insertion site covered with the steri-strip for 3-5 days.   Follow-up PRN problems.  2) CHTN> on HCTZ 25mg , check bp's daily, send log to PCP in 1wk.  Condoms until sure HTN is well controlled  2) Desires pregnancy> start pnv, let us know as soon as gets +HPT  No orders of the defined types were placed in this encounter.   Follow-up: Return for 6/2 or after for , Physical.  Cheral Marker CNM, [facility] 09/15/2022 9:18 AM

## 2022-10-13 ENCOUNTER — Ambulatory Visit: Payer: Medicaid Other | Admitting: Women's Health

## 2022-11-17 ENCOUNTER — Ambulatory Visit: Payer: Medicaid Other | Admitting: Family Medicine

## 2023-03-01 ENCOUNTER — Other Ambulatory Visit: Payer: Self-pay | Admitting: Advanced Practice Midwife

## 2023-03-01 ENCOUNTER — Telehealth: Payer: Self-pay | Admitting: Physician Assistant

## 2023-03-01 DIAGNOSIS — R1011 Right upper quadrant pain: Secondary | ICD-10-CM

## 2023-03-01 NOTE — Progress Notes (Signed)
Virtual Visit Consent   Latasha [name], you are scheduled for a virtual visit with a [facility] provider today. Just as with appointments in the office, your consent must be obtained to participate. Your consent will be active for this visit and any virtual visit you may have with one of our providers in the next 365 days. If you have a MyChart account, a copy of this consent can be sent to you electronically.  As this is a virtual visit, video technology does not allow for your provider to perform a traditional examination. This may limit your provider's ability to fully assess your condition. If your provider identifies any concerns that need to be evaluated in person or the need to arrange testing (such as labs, EKG, etc.), we will make arrangements to do so. Although advances in technology are sophisticated, we cannot ensure that it will always work on either your end or our end. If the connection with a video visit is poor, the visit may have to be switched to a telephone visit. With either a video or telephone visit, we are not always able to ensure that we have a secure connection.  By engaging in this virtual visit, you consent to the provision of healthcare and authorize for your insurance to be billed (if applicable) for the services provided during this visit. Depending on your insurance coverage, you may receive a charge related to this service.  I need to obtain your verbal consent now. Are you willing to proceed with your visit today? Latasha [name] has provided verbal consent on 03/01/2023 for a virtual visit (video or telephone). Tylene Fantasia Ward, PA-C  Date: 03/01/2023 12:37 PM  Virtual Visit via Video Note   I, Tylene Fantasia Ward, connected with  Latasha [name]  ([id number], 09/01/1990) on 03/01/23 at 12:30 PM EST by a video-enabled telemedicine application and verified that I am speaking with the correct person using two identifiers.  Location: Patient: Virtual Visit Location  Patient: Home Provider: Virtual Visit Location Provider: Home Office   I discussed the limitations of evaluation and management by telemedicine and the availability of in person appointments. The patient expressed understanding and agreed to proceed.    History of Present Illness: Latasha [name] is a 32 y.o. who identifies as a female who was assigned female at birth, and is being seen today for right upper quadrant pain, nausea, vomiting that started 3 days ago.  Denies fever.  She reports h/o pancreatitis in the past, today's sx feel similar.  She has a h/o cholecystectomy.  She has tried nothing for the sx.   HPI: HPI  Problems:  Patient Active Problem List   Diagnosis Date Noted   Essential hypertension 08/19/2022   Anemia affecting pregnancy 12/01/2021   Alpha thalassemia silent carrier 09/10/2021   History of pancreatitis 08/26/2021   Dysplasia of cervix 04/12/2013   History of diabetes mellitus 04/12/2013   Asthma 03/07/2013   Rubella non-immune status, antepartum 02/23/2013    Allergies: No Known Allergies Medications:  Current Outpatient Medications:    albuterol (PROVENTIL HFA;VENTOLIN HFA) 108 (90 BASE) MCG/ACT inhaler, Inhale 2 puffs into the lungs every 6 (six) hours as needed for wheezing or shortness of breath. (Patient not taking: Reported on 09/15/2022), Disp: 1 Inhaler, Rfl: 2   hydrochlorothiazide (HYDRODIURIL) 25 MG tablet, Take 1 tablet (25 mg total) by mouth daily., Disp: 90 tablet, Rfl: 1   ibuprofen (ADVIL) 600 MG tablet, Take 1 tablet (600 mg total) by mouth  every 6 (six) hours., Disp: 30 tablet, Rfl: 0   Prenatal MV & Min w/FA-DHA ([employer] PRENATAL GUMMY PO), Take by mouth. (Patient not taking: Reported on 09/15/2022), Disp: , Rfl:   Observations/Objective: Patient is well-developed, well-nourished in no acute distress.  Resting comfortably  at home.  Head is normocephalic, atraumatic.  No labored breathing.  Speech is clear and coherent with logical content.   Patient is alert and oriented at baseline.    Assessment and Plan: 1. RUQ abdominal pain  Advised follow up in ED for evaluation of RUQ.   Follow Up Instructions: I discussed the assessment and treatment plan with the patient. The patient was provided an opportunity to ask questions and all were answered. The patient agreed with the plan and demonstrated an understanding of the instructions.  A copy of instructions were sent to the patient via MyChart unless otherwise noted below.     The patient was advised to call back or seek an in-person evaluation if the symptoms worsen or if the condition fails to improve as anticipated.    Tylene Fantasia Ward, PA-C

## 2023-03-01 NOTE — Patient Instructions (Signed)
  Clearance Coots, thank you for joining Tylene Fantasia Ward, PA-C for today's virtual visit.  While this provider is not your primary care provider (PCP), if your PCP is located in our provider database this encounter information will be shared with them immediately following your visit.   A [facility] MyChart account gives you access to today's visit and all your visits, tests, and labs performed at [facility] " click here if you don't have a [facility] MyChart account or go to [email or web address]  Consent: (Patient) Latasha [name] provided verbal consent for this virtual visit at the beginning of the encounter.  Current Medications:  Current Outpatient Medications:    albuterol (PROVENTIL HFA;VENTOLIN HFA) 108 (90 BASE) MCG/ACT inhaler, Inhale 2 puffs into the lungs every 6 (six) hours as needed for wheezing or shortness of breath. (Patient not taking: Reported on 09/15/2022), Disp: 1 Inhaler, Rfl: 2   hydrochlorothiazide (HYDRODIURIL) 25 MG tablet, Take 1 tablet (25 mg total) by mouth daily., Disp: 90 tablet, Rfl: 1   ibuprofen (ADVIL) 600 MG tablet, Take 1 tablet (600 mg total) by mouth every 6 (six) hours., Disp: 30 tablet, Rfl: 0   Prenatal MV & Min w/FA-DHA ([employer] PRENATAL GUMMY PO), Take by mouth. (Patient not taking: Reported on 09/15/2022), Disp: , Rfl:    Medications ordered in this encounter:  No orders of the defined types were placed in this encounter.    *If you need refills on other medications prior to your next appointment, please contact your pharmacy*  Follow-Up: Call back or seek an in-person evaluation if the symptoms worsen or if the condition fails to improve as anticipated.  [facility] [phone]  Other Instructions Recommend further evaluation in the Emergency Department.    If you have been instructed to have an in-person evaluation today at a local Urgent Care facility, please use the link below. It will take you to a list  of all of our available [facility], including address, phone number and hours of operation. Please do not delay care.  [facility]  If you or a family member do not have a primary care provider, use the link below to schedule a visit and establish care. When you choose a [facility] primary care physician or advanced practice provider, you gain a long-term partner in health. Find a Primary Care Provider  Learn more about [facility]'s in-office and virtual care options:  - Get Care Now

## 2023-04-29 NOTE — L&D Delivery Note (Signed)
 OB/GYN Faculty Practice Delivery Note  Latasha [name] is a 33 y.o. G3P1011 s/p NSVD at [redacted]w[redacted]d. She was admitted for IOL for cHTN.   ROM: 2h 1m with clear fluid GBS Status:  Negative/-- (07/29 1330) Maximum Maternal Temperature: 98.2 F  Labor Progress: Initial SVE: 1.5/50/-3. She then progressed to complete.   Delivery Date/Time: 12/02/2024 at 23:17 Delivery: Called to room and patient was complete and pushing. Head delivered in DOP with good maternal effort. No nuchal cord present. Shoulder and body delivered in usual fashion. Infant with spontaneous cry, placed on mother's abdomen, dried and stimulated. Cord clamped x 2 after several minute delay, and cut by infant's father. Cord blood drawn. Placenta delivered spontaneously with gentle cord traction. Fundus firm with massage and IM pitocin . Labia, perineum, vagina, and cervix inspected and found to have a hemostatic periurethral abrasion otherwise intact.   Baby Weight: pending  Placenta: 3 vessel, intact. Sent to L&D Complications: None Lacerations: None EBL: 122 mL Analgesia: None   Infant:  APGAR (1 MIN):  8 APGAR (5 MINS):  9  Vernell DELENA [employer], MD Family Medicine Resident 12/03/2023, 11:52 PM

## 2023-05-18 ENCOUNTER — Other Ambulatory Visit: Payer: Self-pay | Admitting: Obstetrics & Gynecology

## 2023-05-18 DIAGNOSIS — Z3682 Encounter for antenatal screening for nuchal translucency: Secondary | ICD-10-CM

## 2023-05-19 ENCOUNTER — Ambulatory Visit: Payer: Medicaid Other

## 2023-05-19 ENCOUNTER — Other Ambulatory Visit: Payer: Self-pay | Admitting: Obstetrics & Gynecology

## 2023-05-19 DIAGNOSIS — Z3682 Encounter for antenatal screening for nuchal translucency: Secondary | ICD-10-CM

## 2023-05-19 DIAGNOSIS — Z3A08 8 weeks gestation of pregnancy: Secondary | ICD-10-CM

## 2023-05-19 DIAGNOSIS — O3680X0 Pregnancy with inconclusive fetal viability, not applicable or unspecified: Secondary | ICD-10-CM

## 2023-05-19 DIAGNOSIS — Z3491 Encounter for supervision of normal pregnancy, unspecified, first trimester: Secondary | ICD-10-CM | POA: Diagnosis not present

## 2023-05-19 NOTE — Progress Notes (Signed)
[place] 8+6 wks,single IUP with yolk sac,CRL 21.60 mm,normal ovaries,FHR 164 bpm

## 2023-05-29 ENCOUNTER — Encounter: Payer: Medicaid Other | Admitting: *Deleted

## 2023-05-29 ENCOUNTER — Encounter: Payer: Medicaid Other | Admitting: Advanced Practice Midwife

## 2023-06-15 ENCOUNTER — Other Ambulatory Visit: Payer: Self-pay | Admitting: Obstetrics & Gynecology

## 2023-06-15 DIAGNOSIS — O0990 Supervision of high risk pregnancy, unspecified, unspecified trimester: Secondary | ICD-10-CM | POA: Insufficient documentation

## 2023-06-15 DIAGNOSIS — Z3682 Encounter for antenatal screening for nuchal translucency: Secondary | ICD-10-CM

## 2023-06-16 ENCOUNTER — Encounter: Payer: Self-pay | Admitting: Women's Health

## 2023-06-16 ENCOUNTER — Ambulatory Visit (INDEPENDENT_AMBULATORY_CARE_PROVIDER_SITE_OTHER): Payer: Medicaid Other | Admitting: Women's Health

## 2023-06-16 ENCOUNTER — Encounter: Payer: Medicaid Other | Admitting: *Deleted

## 2023-06-16 ENCOUNTER — Ambulatory Visit: Payer: Medicaid Other

## 2023-06-16 VITALS — BP 123/82 | HR 73 | Wt 229.4 lb

## 2023-06-16 DIAGNOSIS — Z131 Encounter for screening for diabetes mellitus: Secondary | ICD-10-CM | POA: Diagnosis not present

## 2023-06-16 DIAGNOSIS — Z3A12 12 weeks gestation of pregnancy: Secondary | ICD-10-CM

## 2023-06-16 DIAGNOSIS — I10 Essential (primary) hypertension: Secondary | ICD-10-CM

## 2023-06-16 DIAGNOSIS — O0991 Supervision of high risk pregnancy, unspecified, first trimester: Secondary | ICD-10-CM | POA: Diagnosis not present

## 2023-06-16 DIAGNOSIS — Z1332 Encounter for screening for maternal depression: Secondary | ICD-10-CM | POA: Diagnosis not present

## 2023-06-16 DIAGNOSIS — Z3682 Encounter for antenatal screening for nuchal translucency: Secondary | ICD-10-CM | POA: Diagnosis not present

## 2023-06-16 DIAGNOSIS — Z6835 Body mass index (BMI) 35.0-35.9, adult: Secondary | ICD-10-CM

## 2023-06-16 DIAGNOSIS — O0992 Supervision of high risk pregnancy, unspecified, second trimester: Secondary | ICD-10-CM

## 2023-06-16 MED ORDER — PROMETHAZINE HCL 25 MG PO TABS: 12.5000 mg | ORAL_TABLET | Freq: Four times a day (QID) | ORAL | 6 refills | Status: DC | PRN

## 2023-06-16 MED ORDER — BLOOD PRESSURE MONITOR MISC: 0 refills | Status: DC

## 2023-06-16 MED ORDER — ASPIRIN 81 MG PO TBEC
162.0000 mg | DELAYED_RELEASE_TABLET | Freq: Every day | ORAL | 2 refills | Status: DC
Start: 2023-06-16 — End: 2023-12-03

## 2023-06-16 MED ORDER — LABETALOL HCL 200 MG PO TABS
200.0000 mg | ORAL_TABLET | Freq: Two times a day (BID) | ORAL | 3 refills | Status: DC
Start: 2023-06-16 — End: 2023-12-05

## 2023-06-16 NOTE — Progress Notes (Signed)
 [place] 12+6 wks,measurements c/w dates,anterior fundal placenta,normal right ovary,left ovary not visualized,FHR 148 bpm,NB present,NT 1.5 mm,CRL 70.93 mm

## 2023-06-16 NOTE — Patient Instructions (Signed)
 Latasha [name], thank you for choosing our office today! We appreciate the opportunity to meet your healthcare needs. You may receive a short survey by mail, e-mail, or through [employer]. If you are happy with your care we would appreciate if you could take just a few minutes to complete the survey questions. We read all of your comments and take your feedback very seriously. Thank you again for choosing our office.  [facility] [employer] [facility] Team at [facility]  [facility] at [facility] ([address] [place], [place] [address]) Entrance C, located off of [address] Free 24/7 valet parking   Nausea & Vomiting Have saltine crackers or pretzels by your bed and eat a few bites before you raise your head out of bed in the morning Eat small frequent meals throughout the day instead of large meals Drink plenty of fluids throughout the day to stay hydrated, just don't drink a lot of fluids with your meals.  This can make your stomach fill up faster making you feel sick Do not brush your teeth right after you eat Products with real ginger are good for nausea, like ginger ale and ginger hard candy Make sure it says made with real ginger! Sucking on sour candy like lemon heads is also good for nausea If your prenatal vitamins make you nauseated, take them at night so you will sleep through the nausea Sea Bands If you feel like you need medicine for the nausea & vomiting please let us know If you are unable to keep any fluids or food down please let us know   Constipation Drink plenty of fluid, preferably water, throughout the day Eat foods high in fiber such as fruits, vegetables, and grains Exercise, such as walking, is a good way to keep your bowels regular Drink warm fluids, especially warm prune juice, or decaf coffee Eat a 1/2 cup of real oatmeal (not instant), 1/2 cup applesauce, and 1/2-1 cup warm prune juice every day If needed, you may take Colace (docusate sodium) stool  softener once or twice a day to help keep the stool soft.  If you still are having problems with constipation, you may take Miralax once daily as needed to help keep your bowels regular.   Home Blood Pressure Monitoring for Patients   Your provider has recommended that you check your blood pressure (BP) at least once a week at home. If you do not have a blood pressure cuff at home, one will be provided for you. Contact your provider if you have not received your monitor within 1 week.   Helpful Tips for Accurate Home Blood Pressure Checks  Don't smoke, exercise, or drink caffeine 30 minutes before checking your BP Use the restroom before checking your BP (a full bladder can raise your pressure) Relax in a comfortable upright chair Feet on the ground Left arm resting comfortably on a flat surface at the level of your heart Legs uncrossed Back supported Sit quietly and don't talk Place the cuff on your bare arm Adjust snuggly, so that only two fingertips can fit between your skin and the top of the cuff Check 2 readings separated by at least one minute Keep a log of your BP readings For a visual, please reference this diagram: http://ccnc.care/bpdiagram  Provider Name: [facility]     Phone: [phone]  Zone 1: ALL CLEAR  Continue to monitor your symptoms:  BP reading is less than 140 (top number) or less than 90 (bottom  number)  No right upper stomach pain No headaches or seeing spots No feeling nauseated or throwing up No swelling in face and hands  Zone 2: CAUTION Call your doctor's office for any of the following:  BP reading is greater than 140 (top number) or greater than 90 (bottom number)  Stomach pain under your ribs in the middle or right side Headaches or seeing spots Feeling nauseated or throwing up Swelling in face and hands  Zone 3: EMERGENCY  Seek immediate medical care if you have any of the following:  BP reading is greater than160 (top number) or  greater than 110 (bottom number) Severe headaches not improving with Tylenol Serious difficulty catching your breath Any worsening symptoms from Zone 2    First Trimester of Pregnancy The first trimester of pregnancy is from week 1 until the end of week 12 (months 1 through 3). A week after a sperm fertilizes an egg, the egg will implant on the wall of the uterus. This embryo will begin to develop into a baby. Genes from you and your partner are forming the baby. The female genes determine whether the baby is a boy or a girl. At 6-8 weeks, the eyes and face are formed, and the heartbeat can be seen on ultrasound. At the end of 12 weeks, all the baby's organs are formed.  Now that you are pregnant, you will want to do everything you can to have a healthy baby. Two of the most important things are to get good prenatal care and to follow your health care provider's instructions. Prenatal care is all the medical care you receive before the baby's birth. This care will help prevent, find, and treat any problems during the pregnancy and childbirth. BODY CHANGES Your body goes through many changes during pregnancy. The changes vary from woman to woman.  You may gain or lose a couple of pounds at first. You may feel sick to your stomach (nauseous) and throw up (vomit). If the vomiting is uncontrollable, call your health care provider. You may tire easily. You may develop headaches that can be relieved by medicines approved by your health care provider. You may urinate more often. Painful urination may mean you have a bladder infection. You may develop heartburn as a result of your pregnancy. You may develop constipation because certain hormones are causing the muscles that push waste through your intestines to slow down. You may develop hemorrhoids or swollen, bulging veins (varicose veins). Your breasts may begin to grow larger and become tender. Your nipples may stick out more, and the tissue that  surrounds them (areola) may become darker. Your gums may bleed and may be sensitive to brushing and flossing. Dark spots or blotches (chloasma, mask of pregnancy) may develop on your face. This will likely fade after the baby is born. Your menstrual periods will stop. You may have a loss of appetite. You may develop cravings for certain kinds of food. You may have changes in your emotions from day to day, such as being excited to be pregnant or being concerned that something may go wrong with the pregnancy and baby. You may have more vivid and strange dreams. You may have changes in your hair. These can include thickening of your hair, rapid growth, and changes in texture. Some women also have hair loss during or after pregnancy, or hair that feels dry or thin. Your hair will most likely return to normal after your baby is born. WHAT TO EXPECT AT YOUR PRENATAL  VISITS During a routine prenatal visit: You will be weighed to make sure you and the baby are growing normally. Your blood pressure will be taken. Your abdomen will be measured to track your baby's growth. The fetal heartbeat will be listened to starting around week 10 or 12 of your pregnancy. Test results from any previous visits will be discussed. Your health care provider may ask you: How you are feeling. If you are feeling the baby move. If you have had any abnormal symptoms, such as leaking fluid, bleeding, severe headaches, or abdominal cramping. If you have any questions. Other tests that may be performed during your first trimester include: Blood tests to find your blood type and to check for the presence of any previous infections. They will also be used to check for low iron levels (anemia) and Rh antibodies. Later in the pregnancy, blood tests for diabetes will be done along with other tests if problems develop. Urine tests to check for infections, diabetes, or protein in the urine. An ultrasound to confirm the proper growth  and development of the baby. An amniocentesis to check for possible genetic problems. Fetal screens for spina bifida and Down syndrome. You may need other tests to make sure you and the baby are doing well. HOME CARE INSTRUCTIONS  Medicines Follow your health care provider's instructions regarding medicine use. Specific medicines may be either safe or unsafe to take during pregnancy. Take your prenatal vitamins as directed. If you develop constipation, try taking a stool softener if your health care provider approves. Diet Eat regular, well-balanced meals. Choose a variety of foods, such as meat or vegetable-based protein, fish, milk and low-fat dairy products, vegetables, fruits, and whole grain breads and cereals. Your health care provider will help you determine the amount of weight gain that is right for you. Avoid raw meat and uncooked cheese. These carry germs that can cause birth defects in the baby. Eating four or five small meals rather than three large meals a day may help relieve nausea and vomiting. If you start to feel nauseous, eating a few soda crackers can be helpful. Drinking liquids between meals instead of during meals also seems to help nausea and vomiting. If you develop constipation, eat more high-fiber foods, such as fresh vegetables or fruit and whole grains. Drink enough fluids to keep your urine clear or pale yellow. Activity and Exercise Exercise only as directed by your health care provider. Exercising will help you: Control your weight. Stay in shape. Be prepared for labor and delivery. Experiencing pain or cramping in the lower abdomen or low back is a good sign that you should stop exercising. Check with your health care provider before continuing normal exercises. Try to avoid standing for long periods of time. Move your legs often if you must stand in one place for a long time. Avoid heavy lifting. Wear low-heeled shoes, and practice good posture. You may  continue to have sex unless your health care provider directs you otherwise. Relief of Pain or Discomfort Wear a good support bra for breast tenderness.   Take warm sitz baths to soothe any pain or discomfort caused by hemorrhoids. Use hemorrhoid cream if your health care provider approves.   Rest with your legs elevated if you have leg cramps or low back pain. If you develop varicose veins in your legs, wear support hose. Elevate your feet for 15 minutes, 3-4 times a day. Limit salt in your diet. Prenatal Care Schedule your prenatal visits by the  twelfth week of pregnancy. They are usually scheduled monthly at first, then more often in the last 2 months before delivery. Write down your questions. Take them to your prenatal visits. Keep all your prenatal visits as directed by your health care provider. Safety Wear your seat belt at all times when driving. Make a list of emergency phone numbers, including numbers for family, friends, the hospital, and police and fire departments. General Tips Ask your health care provider for a referral to a local prenatal education class. Begin classes no later than at the beginning of month 6 of your pregnancy. Ask for help if you have counseling or nutritional needs during pregnancy. Your health care provider can offer advice or refer you to specialists for help with various needs. Do not use hot tubs, steam rooms, or saunas. Do not douche or use tampons or scented sanitary pads. Do not cross your legs for long periods of time. Avoid cat litter boxes and soil used by cats. These carry germs that can cause birth defects in the baby and possibly loss of the fetus by miscarriage or stillbirth. Avoid all smoking, herbs, alcohol, and medicines not prescribed by your health care provider. Chemicals in these affect the formation and growth of the baby. Schedule a dentist appointment. At home, brush your teeth with a soft toothbrush and be gentle when you floss. SEEK  MEDICAL CARE IF:  You have dizziness. You have mild pelvic cramps, pelvic pressure, or nagging pain in the abdominal area. You have persistent nausea, vomiting, or diarrhea. You have a bad smelling vaginal discharge. You have pain with urination. You notice increased swelling in your face, hands, legs, or ankles. SEEK IMMEDIATE MEDICAL CARE IF:  You have a fever. You are leaking fluid from your vagina. You have spotting or bleeding from your vagina. You have severe abdominal cramping or pain. You have rapid weight gain or loss. You vomit blood or material that looks like coffee grounds. You are exposed to [place] measles and have never had them. You are exposed to fifth disease or chickenpox. You develop a severe headache. You have shortness of breath. You have any kind of trauma, such as from a fall or a car accident. Document Released: 04/08/2001 Document Revised: 08/29/2013 Document Reviewed: 02/22/2013 [facility] Patient Information 2015 [place], [place]. This information is not intended to replace advice given to you by your health care provider. Make sure you discuss any questions you have with your health care provider.

## 2023-06-16 NOTE — Progress Notes (Signed)
 INITIAL OBSTETRICAL VISIT Patient name: Latasha [name] MRN [id number]  Date of birth: May 17, 1990 Chief Complaint:   Initial Prenatal Visit  History of Present Illness:   Latasha [name] is a 33 y.o. G62P1011 African-American female at [redacted]w[redacted]d by [place] at 8 weeks with an Estimated Date of Delivery: 12/23/23 being seen today for her initial obstetrical visit.   No LMP recorded (lmp unknown). Patient is pregnant. Her obstetrical history is significant for  SAB x 1, term SVB w/ GHTN .   Today she reports  n/v, requests prn med . CHTN on hydrochlorothiazide 25mg  daily  Last pap 09/26/21. Results were: NILM w/ HRHPV negative     06/16/2023    9:40 AM 08/18/2022    2:18 PM 08/26/2021    9:32 AM 10/20/2019    8:41 AM 03/27/2017   10:10 AM  Depression screen PHQ 2/9  Decreased Interest 0 0 0 0 0  Down, Depressed, Hopeless 0 0 0 1 0  PHQ - 2 Score 0 0 0 1 0  Altered sleeping 0 1 0 1   Tired, decreased energy 0 0 0 2   Change in appetite 1 0 0 2   Feeling bad or failure about yourself  0 0 0 0   Trouble concentrating 0 0 0 0   Moving slowly or fidgety/restless 0 0 0 0   Suicidal thoughts 0 0 0 0   PHQ-9 Score 1 1 0 6   Difficult doing work/chores  Not difficult at all  Somewhat difficult         06/16/2023    9:40 AM 08/18/2022    2:18 PM 08/26/2021    9:32 AM 10/20/2019    8:42 AM  GAD 7 : Generalized Anxiety Score  Nervous, Anxious, on Edge 0 0 0 0  Control/stop worrying 0 0 0 0  Worry too much - different things 0 0 0 1  Trouble relaxing 0 0 0 1  Restless 0 0 0 0  Easily annoyed or irritable 0 0 0 1  Afraid - awful might happen 0 0 0 0  Total GAD 7 Score 0 0 0 3  Anxiety Difficulty  Not difficult at all  Not difficult at all     Review of Systems:   Pertinent items are noted in HPI Denies cramping/contractions, leakage of fluid, vaginal bleeding, abnormal vaginal discharge w/ itching/odor/irritation, headaches, visual changes, shortness of breath, chest pain, abdominal pain,  severe nausea/vomiting, or problems with urination or bowel movements unless otherwise stated above.  Pertinent History Reviewed:  Reviewed past medical,surgical, social, obstetrical and family history.  Reviewed problem list, medications and allergies. OB History  Gravida Para Term Preterm AB Living  3 1 1  1 1   SAB IAB Ectopic Multiple Live Births  1   0 1    # Outcome Date GA Lbr Len/2nd Weight Sex Type Anes PTL Lv  3 Current           2 Term 02/25/22 [redacted]w[redacted]d 04:16 / 00:06 6 lb 10.9 oz (3.03 kg) M Vag-Spont EPI  LIV     Complications: Gestational hypertension  1 SAB            Physical Assessment:   Vitals:   06/16/23 0939  BP: 123/82  Pulse: 73  Weight: 229 lb 6.4 oz (104.1 kg)  Body mass index is 37.03 kg/m.       Physical Examination:  General appearance - well appearing, and in no distress  Mental status - alert, oriented to person, place, and time  Psych:  She has a normal mood and affect  Skin - warm and dry, normal color, no suspicious lesions noted  Chest - effort normal, all lung fields clear to auscultation bilaterally  Heart - normal rate and regular rhythm  Abdomen - soft, nontender  Extremities:  No swelling or varicosities noted  Thin prep pap is not done   Chaperone: N/A    TODAY'S NT [place] 12+6 wks,measurements c/w dates,anterior fundal placenta,normal right ovary,left ovary not visualized,FHR 148 bpm,NB present,NT 1.5 mm,CRL 70.93 mm   No results found for this or any previous visit (from the past 24 hours).  Assessment & Plan:  1) High-Risk Pregnancy G3P1011 at [redacted]w[redacted]d with an Estimated Date of Delivery: 12/23/23   2) Initial OB visit  3) CHTN> stop hydrochlorothiazide, rx labetalol 200mg  BID, ASA 162mg , f/u 1wk bp check w/ nurse, take labetalol at least 1-2hr before appt.   4) N/V> rx phenergan  5) PG BMI 35  Meds:  Meds ordered this encounter  Medications   Blood Pressure Monitor MISC    Sig: For regular home bp monitoring during pregnancy     Dispense:  1 each    Refill:  0    O09.91 Please mail to patient   aspirin EC 81 MG tablet    Sig: Take 2 tablets (162 mg total) by mouth daily. Swallow whole.    Dispense:  180 tablet    Refill:  2   labetalol (NORMODYNE) 200 MG tablet    Sig: Take 1 tablet (200 mg total) by mouth 2 (two) times daily.    Dispense:  60 tablet    Refill:  3   promethazine (PHENERGAN) 25 MG tablet    Sig: Take 0.5-1 tablets (12.5-25 mg total) by mouth every 6 (six) hours as needed.    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 Does not have home bp cuff. Office bp cuff given: no. Rx sent: yes. Check bp weekly, let us know if consistently >140/90.   Follow-up: Return in about 4 weeks (around 07/14/2023) for HROB, 2nd IT, MD or CNM, in person; 7-8wks from now anatomy u/s and HROB w/ MD/CNM;1wk bp check w/rn.   Orders Placed This Encounter  Procedures   Urine Culture   GC/Chlamydia Probe Amp   Integrated 1   Hemoglobin A1c   PANORAMA PRENATAL TEST   CBC/D/Plt+RPR+Rh+ABO+RubIgG...   Protein / creatinine ratio, urine   Comprehensive metabolic panel    Cheral Marker CNM, [facility] 06/16/2023 10:04 AM

## 2023-06-17 ENCOUNTER — Encounter: Payer: Self-pay | Admitting: Women's Health

## 2023-06-18 LAB — GC/CHLAMYDIA PROBE AMP
Chlamydia trachomatis, NAA: NEGATIVE
Neisseria Gonorrhoeae by PCR: NEGATIVE

## 2023-06-19 ENCOUNTER — Other Ambulatory Visit: Payer: Medicaid Other

## 2023-06-19 LAB — URINE CULTURE: Organism ID, Bacteria: NO GROWTH

## 2023-06-19 LAB — INTEGRATED 1

## 2023-06-20 LAB — INTEGRATED 1
Crown Rump Length: 70.9 mm
Gest. Age on Collection Date: 13.1 wk
Maternal Age at EDD: 33.9 a
Nuchal Translucency (NT): 1.5 mm
Number of Fetuses: 1
PAPP-A Value: 573.5 ng/mL
Sonographer ID#: 309760
Weight: 229 [lb_av]

## 2023-06-20 LAB — RPR, QUANT+TP ABS (REFLEX)
Rapid Plasma Reagin, Quant: 1:1 {titer} — ABNORMAL HIGH
T Pallidum Abs: NONREACTIVE

## 2023-06-20 LAB — COMPREHENSIVE METABOLIC PANEL
ALT: 7 IU/L (ref 0–32)
AST: 9 IU/L (ref 0–40)
Albumin: 4.1 g/dL (ref 3.9–4.9)
Alkaline Phosphatase: 74 IU/L (ref 44–121)
BUN/Creatinine Ratio: 7 — ABNORMAL LOW (ref 9–23)
BUN: 4 mg/dL — ABNORMAL LOW (ref 6–20)
Bilirubin Total: <0.2 mg/dL (ref 0.0–1.2)
CO2: 21 mmol/L (ref 20–29)
Calcium: 9.3 mg/dL (ref 8.7–10.2)
Chloride: 101 mmol/L (ref 96–106)
Creatinine, Ser: 0.59 mg/dL (ref 0.57–1.00)
Globulin, Total: 2.8 g/dL (ref 1.5–4.5)
Glucose: 87 mg/dL (ref 70–99)
Potassium: 4.2 mmol/L (ref 3.5–5.2)
Sodium: 135 mmol/L (ref 134–144)
Total Protein: 6.9 g/dL (ref 6.0–8.5)
eGFR: 122 mL/min/{1.73_m2} (ref >59)

## 2023-06-20 LAB — HEMOGLOBIN A1C
Est. average glucose Bld gHb Est-mCnc: 137 mg/dL
Hgb A1c MFr Bld: 6.4 % — ABNORMAL HIGH (ref 4.8–5.6)

## 2023-06-20 LAB — CBC/D/PLT+RPR+RH+ABO+RUBIGG...
Antibody Screen: NEGATIVE
Basophils Absolute: 0.1 10*3/uL (ref 0.0–0.2)
Basos: 1 %
EOS (ABSOLUTE): 0.1 10*3/uL (ref 0.0–0.4)
Eos: 1 %
HCV Ab: NONREACTIVE
HIV Screen 4th Generation wRfx: NONREACTIVE
Hematocrit: 35.5 % (ref 34.0–46.6)
Hemoglobin: 11.4 g/dL (ref 11.1–15.9)
Hepatitis B Surface Ag: NEGATIVE
Immature Grans (Abs): 0 10*3/uL (ref 0.0–0.1)
Immature Granulocytes: 0 %
Lymphocytes Absolute: 2.2 10*3/uL (ref 0.7–3.1)
Lymphs: 31 %
MCH: 26.4 pg — ABNORMAL LOW (ref 26.6–33.0)
MCHC: 32.1 g/dL (ref 31.5–35.7)
MCV: 82 fL (ref 79–97)
Monocytes Absolute: 0.4 10*3/uL (ref 0.1–0.9)
Monocytes: 6 %
Neutrophils Absolute: 4.3 10*3/uL (ref 1.4–7.0)
Neutrophils: 61 %
Platelets: 300 10*3/uL (ref 150–450)
RBC: 4.32 x10E6/uL (ref 3.77–5.28)
RDW: 15.2 % (ref 11.7–15.4)
RPR Ser Ql: REACTIVE — AB
Rh Factor: POSITIVE
Rubella Antibodies, IGG: <0.9 {index} — ABNORMAL LOW (ref >0.99)
WBC: 7.2 10*3/uL (ref 3.4–10.8)

## 2023-06-20 LAB — PROTEIN / CREATININE RATIO, URINE
Creatinine, Urine: 105 mg/dL
Protein, Ur: 8.3 mg/dL
Protein/Creat Ratio: 79 mg/g{creat} (ref 0–200)

## 2023-06-20 LAB — HCV INTERPRETATION

## 2023-06-22 ENCOUNTER — Encounter: Payer: Self-pay | Admitting: Women's Health

## 2023-06-24 LAB — PANORAMA PRENATAL TEST FULL PANEL:PANORAMA TEST PLUS 5 ADDITIONAL MICRODELETIONS: FETAL FRACTION: 10.7

## 2023-06-25 ENCOUNTER — Encounter: Payer: Self-pay | Admitting: Women's Health

## 2023-06-26 ENCOUNTER — Ambulatory Visit: Payer: Medicaid Other

## 2023-06-26 ENCOUNTER — Other Ambulatory Visit: Payer: Medicaid Other

## 2023-06-26 VITALS — BP 126/71 | HR 87

## 2023-06-26 DIAGNOSIS — Z0130 Encounter for examination of blood pressure without abnormal findings: Secondary | ICD-10-CM

## 2023-06-26 DIAGNOSIS — Z3A14 14 weeks gestation of pregnancy: Secondary | ICD-10-CM

## 2023-06-26 DIAGNOSIS — I10 Essential (primary) hypertension: Secondary | ICD-10-CM

## 2023-06-26 DIAGNOSIS — R7309 Other abnormal glucose: Secondary | ICD-10-CM

## 2023-06-26 DIAGNOSIS — Z131 Encounter for screening for diabetes mellitus: Secondary | ICD-10-CM

## 2023-06-26 NOTE — Progress Notes (Signed)
   NURSE VISIT- BLOOD PRESSURE CHECK  SUBJECTIVE:  Latasha [name] is a 33 y.o. G53P1011 female here for BP check. She is [redacted]w[redacted]d pregnant    HYPERTENSION ROS:  Pregnant:  Severe headaches that don't go away with tylenol/other medicines: No  Visual changes (seeing spots/double/blurred vision) No  Severe pain under right breast breast or in center of upper chest No  Severe nausea/vomiting No  Taking medicines as instructed yes    OBJECTIVE:  BP 126/71 (BP Location: Right Arm, Patient Position: Sitting, Cuff Size: Normal)   Pulse 87   LMP  (LMP Unknown)   Appearance alert, well appearing, and in no distress.  ASSESSMENT: Pregnancy [redacted]w[redacted]d  blood pressure check  PLAN: Discussed with Philipp Deputy, CNM   Recommendations: no changes needed   Follow-up: as scheduled   Jobe Marker  06/26/2023 9:56 AM

## 2023-06-27 LAB — GLUCOSE TOLERANCE, 2 HOURS W/ 1HR
Glucose, 1 hour: 113 mg/dL (ref 70–179)
Glucose, 2 hour: 136 mg/dL (ref 70–152)
Glucose, Fasting: 81 mg/dL (ref 70–91)

## 2023-06-29 ENCOUNTER — Encounter: Payer: Self-pay | Admitting: Women's Health

## 2023-07-15 ENCOUNTER — Ambulatory Visit (INDEPENDENT_AMBULATORY_CARE_PROVIDER_SITE_OTHER): Payer: Medicaid Other | Admitting: Women's Health

## 2023-07-15 ENCOUNTER — Encounter: Payer: Self-pay | Admitting: Women's Health

## 2023-07-15 VITALS — BP 119/76 | HR 82 | Wt 226.2 lb

## 2023-07-15 DIAGNOSIS — O0991 Supervision of high risk pregnancy, unspecified, first trimester: Secondary | ICD-10-CM

## 2023-07-15 DIAGNOSIS — Z1379 Encounter for other screening for genetic and chromosomal anomalies: Secondary | ICD-10-CM

## 2023-07-15 DIAGNOSIS — O0992 Supervision of high risk pregnancy, unspecified, second trimester: Secondary | ICD-10-CM | POA: Diagnosis not present

## 2023-07-15 DIAGNOSIS — Z1389 Encounter for screening for other disorder: Secondary | ICD-10-CM | POA: Diagnosis not present

## 2023-07-15 DIAGNOSIS — Z331 Pregnant state, incidental: Secondary | ICD-10-CM

## 2023-07-15 DIAGNOSIS — O10919 Unspecified pre-existing hypertension complicating pregnancy, unspecified trimester: Secondary | ICD-10-CM

## 2023-07-15 DIAGNOSIS — R768 Other specified abnormal immunological findings in serum: Secondary | ICD-10-CM | POA: Diagnosis not present

## 2023-07-15 DIAGNOSIS — Z363 Encounter for antenatal screening for malformations: Secondary | ICD-10-CM

## 2023-07-15 DIAGNOSIS — O10912 Unspecified pre-existing hypertension complicating pregnancy, second trimester: Secondary | ICD-10-CM

## 2023-07-15 DIAGNOSIS — Z3A17 17 weeks gestation of pregnancy: Secondary | ICD-10-CM

## 2023-07-15 LAB — POCT URINALYSIS DIPSTICK OB
Blood, UA: NEGATIVE
Glucose, UA: NEGATIVE
Ketones, UA: NEGATIVE
Leukocytes, UA: NEGATIVE
Nitrite, UA: NEGATIVE

## 2023-07-15 MED ORDER — SILVER SULFADIAZINE 1 % EX CREA: 1.0000 | TOPICAL_CREAM | Freq: Every day | CUTANEOUS | 2 refills | Status: DC
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
## 2023-07-15 NOTE — Patient Instructions (Signed)
Icelynn, thank you for choosing our office today! We appreciate the opportunity to meet your healthcare needs. You may receive a short survey by mail, e-mail, or through [employer]. If you are happy with your care we would appreciate if you could take just a few minutes to complete the survey questions. We read all of your comments and take your feedback very seriously. Thank you again for choosing our office.  Center for [employer] Team at [facility] at [facility] ([address] [place], [place] [address]) Entrance C, located off of [address] Free 24/7 valet parking  Go to [email or web address] to register for FREE online childbirth classes  Call the office [phone]) or go to [facility] if: You begin to severe cramping Your water breaks.  Sometimes it is a big gush of fluid, sometimes it is just a trickle that keeps getting your panties wet or running down your legs You have vaginal bleeding.  It is normal to have a small amount of spotting if your cervix was checked.   [facility] Pediatricians/Family Doctors [facility]): [address] Colette Ribas, [phone]           [facility]: [address], [phone]                [facility]): [address], [phone] (call to ask if accepting patients) [facility] Department: [address], [place], [phone]    [facility] Pediatricians/Family Doctors [facility]): [phone], [phone] [facility]: [address] [place], [phone] [facility]: [address], [phone]  [facility] Doctors  [facility]): [phone] [facility]: [address] [phone]   [facility] Doctors [facility]: [address], [phone]  [facility] Doctors  [facility]: [phone], [phone]  Home Blood Pressure Monitoring for Patients   Your provider has recommended that you check your blood pressure (BP) at least once a week at home. If you do not have a blood pressure cuff at home, one will be provided for you. Contact your provider if you have not received your monitor within 1 week.   Helpful Tips for Accurate Home Blood Pressure Checks  Don't smoke, exercise, or drink caffeine 30 minutes before checking your BP Use the restroom before checking your BP (a full bladder can raise your pressure) Relax in a comfortable upright chair Feet on the ground Left arm resting comfortably on a flat surface at the level of your heart Legs uncrossed Back supported Sit quietly and don't talk Place the cuff on your bare arm Adjust snuggly, so that only two fingertips can fit between your skin and the top of the cuff Check 2 readings separated by at least one minute Keep a log of your BP readings For a visual, please reference this diagram: http://ccnc.care/bpdiagram  Provider Name: [facility]     Phone: [phone]  Zone 1: ALL CLEAR  Continue to monitor your symptoms:  BP reading is less than 140 (top number) or less than 90 (bottom number)  No right upper stomach pain No headaches or seeing spots No feeling nauseated or throwing up No swelling in face and hands  Zone 2: CAUTION Call your doctor's office for any of the following:  BP reading is greater than 140 (top number) or greater than  90 (bottom number)  Stomach pain under your ribs in the middle or right side Headaches or seeing spots Feeling nauseated or throwing up Swelling in face and hands  Zone 3: EMERGENCY  Seek immediate medical care if you have any of the following:  BP reading is greater than160 (top number) or greater than 110 (bottom number) Severe headaches not improving with Tylenol Serious difficulty catching your breath Any worsening symptoms from  Zone 2     Second Trimester of Pregnancy The second trimester is from week 14 through week 27 (months 4 through 6). The second trimester is often a time when you feel your best. Your body has adjusted to being pregnant, and you begin to feel better physically. Usually, morning sickness has lessened or quit completely, you may have more energy, and you may have an increase in appetite. The second trimester is also a time when the fetus is growing rapidly. At the end of the sixth month, the fetus is about 9 inches long and weighs about 1 pounds. You will likely begin to feel the baby move (quickening) between 16 and 20 weeks of pregnancy. Body changes during your second trimester Your body continues to go through many changes during your second trimester. The changes vary from woman to woman. Your weight will continue to increase. You will notice your lower abdomen bulging out. You may begin to get stretch marks on your hips, abdomen, and breasts. You may develop headaches that can be relieved by medicines. The medicines should be approved by your health care provider. You may urinate more often because the fetus is pressing on your bladder. You may develop or continue to have heartburn as a result of your pregnancy. You may develop constipation because certain hormones are causing the muscles that push waste through your intestines to slow down. You may develop hemorrhoids or swollen, bulging veins (varicose veins). You may have back pain. This is caused by: Weight gain. Pregnancy hormones that are relaxing the joints in your pelvis. A shift in weight and the muscles that support your balance. Your breasts will continue to grow and they will continue to become tender. Your gums may bleed and may be sensitive to brushing and flossing. Dark spots or blotches (chloasma, mask of pregnancy) may develop on your face. This will likely fade after the baby is born. A dark line from your belly button to  the pubic area (linea nigra) may appear. This will likely fade after the baby is born. You may have changes in your hair. These can include thickening of your hair, rapid growth, and changes in texture. Some women also have hair loss during or after pregnancy, or hair that feels dry or thin. Your hair will most likely return to normal after your baby is born.  What to expect at prenatal visits During a routine prenatal visit: You will be weighed to make sure you and the fetus are growing normally. Your blood pressure will be taken. Your abdomen will be measured to track your baby's growth. The fetal heartbeat will be listened to. Any test results from the previous visit will be discussed.  Your health care provider may ask you: How you are feeling. If you are feeling the baby move. If you have had any abnormal symptoms, such as leaking fluid, bleeding, severe headaches, or abdominal cramping. If you are using any tobacco products, including cigarettes, chewing tobacco, and electronic cigarettes. If you have any questions.  Other tests that may be performed during   your second trimester include: Blood tests that check for: Low iron levels (anemia). High blood sugar that affects pregnant women (gestational diabetes) between 24 and 28 weeks. Rh antibodies. This is to check for a protein on red blood cells (Rh factor). Urine tests to check for infections, diabetes, or protein in the urine. An ultrasound to confirm the proper growth and development of the baby. An amniocentesis to check for possible genetic problems. Fetal screens for spina bifida and Down syndrome. HIV (human immunodeficiency virus) testing. Routine prenatal testing includes screening for HIV, unless you choose not to have this test.  Follow these instructions at home: Medicines Follow your health care provider's instructions regarding medicine use. Specific medicines may be either safe or unsafe to take during  pregnancy. Take a prenatal vitamin that contains at least 600 micrograms (mcg) of folic acid. If you develop constipation, try taking a stool softener if your health care provider approves. Eating and drinking Eat a balanced diet that includes fresh fruits and vegetables, whole grains, good sources of protein such as meat, eggs, or tofu, and low-fat dairy. Your health care provider will help you determine the amount of weight gain that is right for you. Avoid raw meat and uncooked cheese. These carry germs that can cause birth defects in the baby. If you have low calcium intake from food, talk to your health care provider about whether you should take a daily calcium supplement. Limit foods that are high in fat and processed sugars, such as fried and sweet foods. To prevent constipation: Drink enough fluid to keep your urine clear or pale yellow. Eat foods that are high in fiber, such as fresh fruits and vegetables, whole grains, and beans. Activity Exercise only as directed by your health care provider. Most women can continue their usual exercise routine during pregnancy. Try to exercise for 30 minutes at least 5 days a week. Stop exercising if you experience uterine contractions. Avoid heavy lifting, wear low heel shoes, and practice good posture. A sexual relationship may be continued unless your health care provider directs you otherwise. Relieving pain and discomfort Wear a good support bra to prevent discomfort from breast tenderness. Take warm sitz baths to soothe any pain or discomfort caused by hemorrhoids. Use hemorrhoid cream if your health care provider approves. Rest with your legs elevated if you have leg cramps or low back pain. If you develop varicose veins, wear support hose. Elevate your feet for 15 minutes, 3-4 times a day. Limit salt in your diet. Prenatal Care Write down your questions. Take them to your prenatal visits. Keep all your prenatal visits as told by your health  care provider. This is important. Safety Wear your seat belt at all times when driving. Make a list of emergency phone numbers, including numbers for family, friends, the hospital, and police and fire departments. General instructions Ask your health care provider for a referral to a local prenatal education class. Begin classes no later than the beginning of month 6 of your pregnancy. Ask for help if you have counseling or nutritional needs during pregnancy. Your health care provider can offer advice or refer you to specialists for help with various needs. Do not use hot tubs, steam rooms, or saunas. Do not douche or use tampons or scented sanitary pads. Do not cross your legs for long periods of time. Avoid cat litter boxes and soil used by cats. These carry germs that can cause birth defects in the baby and possibly loss of the   fetus by miscarriage or stillbirth. Avoid all smoking, herbs, alcohol, and unprescribed drugs. Chemicals in these products can affect the formation and growth of the baby. Do not use any products that contain nicotine or tobacco, such as cigarettes and e-cigarettes. If you need help quitting, ask your health care provider. Visit your dentist if you have not gone yet during your pregnancy. Use a soft toothbrush to brush your teeth and be gentle when you floss. Contact a health care provider if: You have dizziness. You have mild pelvic cramps, pelvic pressure, or nagging pain in the abdominal area. You have persistent nausea, vomiting, or diarrhea. You have a bad smelling vaginal discharge. You have pain when you urinate. Get help right away if: You have a fever. You are leaking fluid from your vagina. You have spotting or bleeding from your vagina. You have severe abdominal cramping or pain. You have rapid weight gain or weight loss. You have shortness of breath with chest pain. You notice sudden or extreme swelling of your face, hands, ankles, feet, or legs. You  have not felt your baby move in over an hour. You have severe headaches that do not go away when you take medicine. You have vision changes. Summary The second trimester is from week 14 through week 27 (months 4 through 6). It is also a time when the fetus is growing rapidly. Your body goes through many changes during pregnancy. The changes vary from woman to woman. Avoid all smoking, herbs, alcohol, and unprescribed drugs. These chemicals affect the formation and growth your baby. Do not use any tobacco products, such as cigarettes, chewing tobacco, and e-cigarettes. If you need help quitting, ask your health care provider. Contact your health care provider if you have any questions. Keep all prenatal visits as told by your health care provider. This is important. This information is not intended to replace advice given to you by your health care provider. Make sure you discuss any questions you have with your health care provider. Document Released: 04/08/2001 Document Revised: 09/20/2015 Document Reviewed: 06/15/2012 Elsevier Interactive Patient Education  2017 Elsevier Inc.  

## 2023-07-16 ENCOUNTER — Encounter: Payer: Self-pay | Admitting: Women's Health

## 2023-07-16 LAB — RPR: RPR Ser Ql: NONREACTIVE

## 2023-07-17 LAB — INTEGRATED 2
AFP MoM: 0.84
Alpha-Fetoprotein: 24.5 ng/mL
Crown Rump Length: 70.9 mm
DIA MoM: 1.82
DIA Value: 224.7 pg/mL
Estriol, Unconjugated: 0.7 ng/mL
Gest. Age on Collection Date: 13.1 wk
Gestational Age: 17.3 wk
Maternal Age at EDD: 33.9 a
Nuchal Translucency (NT): 1.5 mm
Nuchal Translucency MoM: 0.75
Number of Fetuses: 1
PAPP-A MoM: 0.78
PAPP-A Value: 573.5 ng/mL
Sonographer ID#: 309760
Test Results:: NEGATIVE
Weight: 229 [lb_av]
Weight: 229 [lb_av]
hCG MoM: 2.2
hCG Value: 47.4 [IU]/mL
uE3 MoM: 0.59

## 2023-07-20 ENCOUNTER — Encounter: Payer: Self-pay | Admitting: Women's Health

## 2023-07-27 ENCOUNTER — Encounter: Payer: Self-pay | Admitting: Women's Health

## 2023-08-04 ENCOUNTER — Other Ambulatory Visit: Payer: Medicaid Other

## 2023-08-04 ENCOUNTER — Encounter: Payer: Medicaid Other | Admitting: Obstetrics & Gynecology

## 2023-08-10 ENCOUNTER — Ambulatory Visit (INDEPENDENT_AMBULATORY_CARE_PROVIDER_SITE_OTHER)

## 2023-08-10 ENCOUNTER — Ambulatory Visit (INDEPENDENT_AMBULATORY_CARE_PROVIDER_SITE_OTHER): Admitting: Obstetrics and Gynecology

## 2023-08-10 ENCOUNTER — Encounter: Payer: Self-pay | Admitting: Obstetrics and Gynecology

## 2023-08-10 VITALS — BP 100/67 | HR 78 | Wt 221.5 lb

## 2023-08-10 DIAGNOSIS — Z3A20 20 weeks gestation of pregnancy: Secondary | ICD-10-CM

## 2023-08-10 DIAGNOSIS — O10912 Unspecified pre-existing hypertension complicating pregnancy, second trimester: Secondary | ICD-10-CM

## 2023-08-10 DIAGNOSIS — O0992 Supervision of high risk pregnancy, unspecified, second trimester: Secondary | ICD-10-CM

## 2023-08-10 DIAGNOSIS — O10919 Unspecified pre-existing hypertension complicating pregnancy, unspecified trimester: Secondary | ICD-10-CM

## 2023-08-10 DIAGNOSIS — Z363 Encounter for antenatal screening for malformations: Secondary | ICD-10-CM

## 2023-08-10 DIAGNOSIS — O0991 Supervision of high risk pregnancy, unspecified, first trimester: Secondary | ICD-10-CM

## 2023-08-10 DIAGNOSIS — Z3A12 12 weeks gestation of pregnancy: Secondary | ICD-10-CM

## 2023-08-10 LAB — POCT URINALYSIS DIPSTICK OB
Blood, UA: NEGATIVE
Glucose, UA: NEGATIVE
Ketones, UA: NEGATIVE
Nitrite, UA: NEGATIVE

## 2023-08-10 MED ORDER — BLOOD PRESSURE MONITOR MISC
0 refills | Status: DC
Start: 2023-08-10 — End: 2023-12-05

## 2023-08-10 NOTE — Progress Notes (Signed)
   PRENATAL VISIT NOTE  Subjective:  Latasha [name] is a 33 y.o. G3P1011 at [redacted]w[redacted]d being seen today for ongoing prenatal care.  She is currently monitored for the following issues for this high-risk pregnancy and has Rubella non-immune status, antepartum; Asthma; Dysplasia of cervix; History of diabetes mellitus; History of pancreatitis; Alpha thalassemia silent carrier; Essential hypertension; and Supervision of high-risk pregnancy on their problem list.  Patient reports no complaints.  Contractions: Not present. Vag. Bleeding: None.  Movement: Present. Denies leaking of fluid.   The following portions of the patient's history were reviewed and updated as appropriate: allergies, current medications, past family history, past medical history, past social history, past surgical history and problem list.   Objective:   Vitals:   08/10/23 1609  BP: 100/67  Pulse: 78  Weight: 221 lb 8 oz (100.5 kg)    Fetal Status:     Movement: Present     General:  Alert, oriented and cooperative. Patient is in no acute distress.  Skin: Skin is warm and dry. No rash noted.   Cardiovascular: Normal heart rate noted  Respiratory: Normal respiratory effort, no problems with respiration noted  Abdomen: Soft, gravid, appropriate for gestational age.  Pain/Pressure: Absent     Pelvic: Cervical exam deferred        Extremities: Normal range of motion.  Edema: None  Mental Status: Normal mood and affect. Normal behavior. Normal judgment and thought content.   Assessment and Plan:  Pregnancy: G3P1011 at [redacted]w[redacted]d 1. Supervision of high risk pregnancy in second trimester (Primary) BP and FHR normal Doing well, feeling regular movement   - POC Urinalysis Dipstick OB  2. [redacted] weeks gestation of pregnancy US  20+5 wks,cephalic,anterior placenta gr 0,normal ovaries,CX 2.8 cm,SVP of fluid 5.9 cm,FHR 140 bpm,anatomy complete,no obvious abnormalities  - POC Urinalysis Dipstick OB   3. Chronic hypertension  affecting pregnancy Normotensive on labetalol , new BP monitor to be sent    Preterm labor symptoms and general obstetric precautions including but not limited to vaginal bleeding, contractions, leaking of fluid and fetal movement were reviewed in detail with the patient. Please refer to After Visit Summary for other counseling recommendations.   Return in 4 weeks for routine prenatal  Susi Eric, FNP

## 2023-08-10 NOTE — Progress Notes (Signed)
 US  20+5 wks,cephalic,anterior placenta gr 0,normal ovaries,CX 2.8 cm,SVP of fluid 5.9 cm,FHR 140 bpm,anatomy complete,no obvious abnormalities

## 2023-09-07 ENCOUNTER — Encounter: Admitting: Women's Health

## 2023-09-09 ENCOUNTER — Ambulatory Visit (INDEPENDENT_AMBULATORY_CARE_PROVIDER_SITE_OTHER): Admitting: Women's Health

## 2023-09-09 ENCOUNTER — Encounter: Payer: Self-pay | Admitting: Women's Health

## 2023-09-09 VITALS — BP 138/85 | HR 91 | Wt 223.0 lb

## 2023-09-09 DIAGNOSIS — O0992 Supervision of high risk pregnancy, unspecified, second trimester: Secondary | ICD-10-CM | POA: Diagnosis not present

## 2023-09-09 DIAGNOSIS — Z3A25 25 weeks gestation of pregnancy: Secondary | ICD-10-CM

## 2023-09-09 DIAGNOSIS — O10912 Unspecified pre-existing hypertension complicating pregnancy, second trimester: Secondary | ICD-10-CM | POA: Diagnosis not present

## 2023-09-09 DIAGNOSIS — O10919 Unspecified pre-existing hypertension complicating pregnancy, unspecified trimester: Secondary | ICD-10-CM

## 2023-09-09 LAB — POCT URINALYSIS DIPSTICK OB
Blood, UA: NEGATIVE
Glucose, UA: NEGATIVE
Ketones, UA: NEGATIVE
Leukocytes, UA: NEGATIVE
Nitrite, UA: NEGATIVE
POC,PROTEIN,UA: NEGATIVE

## 2023-09-09 NOTE — Progress Notes (Signed)
 HIGH-RISK PREGNANCY VISIT Patient name: Latasha [name] MRN [id number]  Date of birth: Feb 23, 1991 Chief Complaint:   Routine Prenatal Visit  History of Present Illness:   Latasha [name] is a 33 y.o. G27P1011 female at [redacted]w[redacted]d with an Estimated Date of Delivery: 12/23/23 being seen today for ongoing management of a high-risk pregnancy complicated by chronic hypertension currently on labetalol  200mg  BID and PG BMI 35.    Today she reports no complaints. Contractions: Not present. Vag. Bleeding: None.  Movement: Present. denies leaking of fluid.      06/16/2023    9:40 AM 08/18/2022    2:18 PM 08/26/2021    9:32 AM 10/20/2019    8:41 AM 03/27/2017   10:10 AM  Depression screen PHQ 2/9  Decreased Interest 0 0 0 0 0  Down, Depressed, Hopeless 0 0 0 1 0  PHQ - 2 Score 0 0 0 1 0  Altered sleeping 0 1 0 1   Tired, decreased energy 0 0 0 2   Change in appetite 1 0 0 2   Feeling bad or failure about yourself  0 0 0 0   Trouble concentrating 0 0 0 0   Moving slowly or fidgety/restless 0 0 0 0   Suicidal thoughts 0 0 0 0   PHQ-9 Score 1 1 0 6   Difficult doing work/chores  Not difficult at all  Somewhat difficult         06/16/2023    9:40 AM 08/18/2022    2:18 PM 08/26/2021    9:32 AM 10/20/2019    8:42 AM  GAD 7 : Generalized Anxiety Score  Nervous, Anxious, on Edge 0 0 0 0  Control/stop worrying 0 0 0 0  Worry too much - different things 0 0 0 1  Trouble relaxing 0 0 0 1  Restless 0 0 0 0  Easily annoyed or irritable 0 0 0 1  Afraid - awful might happen 0 0 0 0  Total GAD 7 Score 0 0 0 3  Anxiety Difficulty  Not difficult at all  Not difficult at all     Review of Systems:   Pertinent items are noted in HPI Denies abnormal vaginal discharge w/ itching/odor/irritation, headaches, visual changes, shortness of breath, chest pain, abdominal pain, severe nausea/vomiting, or problems with urination or bowel movements unless otherwise stated above. Pertinent History Reviewed:   Reviewed past medical,surgical, social, obstetrical and family history.  Reviewed problem list, medications and allergies. Physical Assessment:   Vitals:   09/09/23 1422  BP: 138/85  Pulse: 91  Weight: 223 lb (101.2 kg)  Body mass index is 35.99 kg/m.           Physical Examination:   General appearance: alert, well appearing, and in no distress  Mental status: alert, oriented to person, place, and time  Skin: warm & dry   Extremities:      Cardiovascular: normal heart rate noted  Respiratory: normal respiratory effort, no distress  Abdomen: gravid, soft, non-tender  Pelvic: Cervical exam deferred         Fetal Status: Fetal Heart Rate (bpm): 153 Fundal Height: 26 cm Movement: Present    Fetal Surveillance Testing today: doppler   Chaperone: N/A  Results for orders placed or performed in visit on 09/09/23 (from the past 24 hours)  POC Urinalysis Dipstick OB   Collection Time: 09/09/23  2:24 PM  Result Value Ref Range   Color, UA     Clarity, UA  Glucose, UA Negative Negative   Bilirubin, UA     Ketones, UA neg    Spec Grav, UA     Blood, UA neg    pH, UA     POC,PROTEIN,UA Negative Negative, Trace, Small (1+), Moderate (2+), Large (3+), 4+   Urobilinogen, UA     Nitrite, UA neg    Leukocytes, UA Negative Negative   Appearance     Odor      Assessment & Plan:  High-risk pregnancy: G3P1011 at [redacted]w[redacted]d with an Estimated Date of Delivery: 12/23/23   1) CHTN, stable on labetalol  200mg  BID, ASA  2) PGBMI35, currently 35  Meds: No orders of the defined types were placed in this encounter.   Labs/procedures today: none  Treatment Plan: EFW q 4w @ 24w    2x/wk testing nst/sono @ 32wks     Deliver 37-39.0wks (or as per MFM w/ poor control)____   Reviewed: Preterm labor symptoms and general obstetric precautions including but not limited to vaginal bleeding, contractions, leaking of fluid and fetal movement were reviewed in detail with the patient.  All questions  were answered. Does have home bp cuff. Office bp cuff given: not applicable. Check bp weekly, let us  know if consistently >140 and/or >90.  Follow-up: Return in about 3 weeks (around 09/30/2023) for HROB, US :EFW, PN2, MD or CNM, in person.   Future Appointments  Date Time Provider Department Center  10/02/2023  8:30 AM [facility] LAB [facility]  10/02/2023  9:15 AM [facility] US  [facility] None    Orders Placed This Encounter  Procedures   US  OB Follow Up   POC Urinalysis Dipstick OB   Ferd Householder CNM, [facility] 09/09/2023 2:39 PM

## 2023-09-09 NOTE — Patient Instructions (Signed)
 Latasha [name], thank you for choosing our office today! We appreciate the opportunity to meet your healthcare needs. You may receive a short survey by mail, e-mail, or through [employer]. If you are happy with your care we would appreciate if you could take just a few minutes to complete the survey questions. We read all of your comments and take your feedback very seriously. Thank you again for choosing our office.  Center for [employer] Team at [facility]  [facility] at [facility] ([address] [place], [place] [address]) Entrance C, located off of [address] Free 24/7 valet parking   You will have your sugar test next visit.  Please do not eat or drink anything after midnight the night before you come, not even water.  You will be here for at least two hours.  Please make an appointment online for the bloodwork at [email or web address] for 8:00am (or as close to this as possible). Make sure you select the [facility] service center.   CLASSES: Go to [email or web address] to register for classes (childbirth, breastfeeding, waterbirth, infant CPR, daddy bootcamp, etc.)  Call the office [phone]) or go to [facility] if: You begin to have strong, frequent contractions Your water breaks.  Sometimes it is a big gush of fluid, sometimes it is just a trickle that keeps getting your panties wet or running down your legs You have vaginal bleeding.  It is normal to have a small amount of spotting if your cervix was checked.  You don't feel your baby moving like normal.  If you don't, get you something to eat and drink and lay down and focus on feeling your baby move.   If your baby is still not moving like normal, you should call the office or go to [facility].  Call the office [phone]) or go to [facility] hospital for these signs of pre-eclampsia: Severe headache that does not go away with Tylenol  Visual changes- seeing spots, double, blurred vision Pain under your right breast or  upper abdomen that does not go away with Tums or heartburn medicine Nausea and/or vomiting Severe swelling in your hands, feet, and face    [place] [facility]ians/Family Doctors [facility]): [address] Meg Spina, [phone]           [facility]: [address], [phone]                [facility]): [address], [phone]  [facility] Department: [address], [place], [phone]    [facility] Pediatricians/Family Doctors [facility]): [address], [phone] [facility]: [address] [place], [phone] [facility]: [address], [phone]  [facility] Doctors  [facility]): [phone] [facility]: [address] [phone]   [facility] Doctors [facility]: [address] [phone]  [facility] Doctors  [facility]: [phone], [phone]  Home Blood Pressure Monitoring for Patients   Your provider has recommended that you check your blood pressure (BP) at least once a week at home. If you do not have a blood pressure cuff at home, one will be provided for you. Contact your provider if you have not received your monitor within 1 week.   Helpful Tips for Accurate Home Blood Pressure Checks  Don't smoke, exercise, or drink caffeine 30 minutes before checking  your BP Use the restroom before checking your BP (a full bladder can raise your pressure) Relax in a comfortable upright chair Feet on the ground Left arm resting comfortably on a flat surface at the level of your heart Legs uncrossed Back supported Sit quietly and don't talk Place the cuff on your bare arm Adjust snuggly, so that only two fingertips can fit between your skin and the top of the cuff Check 2 readings separated by at least one  minute Keep a log of your BP readings For a visual, please reference this diagram: http://ccnc.care/bpdiagram  Provider Name: [facility]     Phone: [phone]  Zone 1: ALL CLEAR  Continue to monitor your symptoms:  BP reading is less than 140 (top number) or less than 90 (bottom number)  No right upper stomach pain No headaches or seeing spots No feeling nauseated or throwing up No swelling in face and hands  Zone 2: CAUTION Call your doctor's office for any of the following:  BP reading is greater than 140 (top number) or greater than 90 (bottom number)  Stomach pain under your ribs in the middle or right side Headaches or seeing spots Feeling nauseated or throwing up Swelling in face and hands  Zone 3: EMERGENCY  Seek immediate medical care if you have any of the following:  BP reading is greater than160 (top number) or greater than 110 (bottom number) Severe headaches not improving with Tylenol  Serious difficulty catching your breath Any worsening symptoms from Zone 2   Second Trimester of Pregnancy The second trimester is from week 13 through week 28, months 4 through 6. The second trimester is often a time when you feel your best. Your body has also adjusted to being pregnant, and you begin to feel better physically. Usually, morning sickness has lessened or quit completely, you may have more energy, and you may have an increase in appetite. The second trimester is also a time when the fetus is growing rapidly. At the end of the sixth month, the fetus is about 9 inches long and weighs about 1 pounds. You will likely begin to feel the baby move (quickening) between 18 and 20 weeks of the pregnancy. BODY CHANGES Your body goes through many changes during pregnancy. The changes vary from woman to woman.  Your weight will continue to increase. You will notice your lower abdomen bulging out. You may begin to get stretch marks on your hips, abdomen, and breasts. You may  develop headaches that can be relieved by medicines approved by your health care provider. You may urinate more often because the fetus is pressing on your bladder. You may develop or continue to have heartburn as a result of your pregnancy. You may develop constipation because certain hormones are causing the muscles that push waste through your intestines to slow down. You may develop hemorrhoids or swollen, bulging veins (varicose veins). You may have back pain because of the weight gain and pregnancy hormones relaxing your joints between the bones in your pelvis and as a result of a shift in weight and the muscles that support your balance. Your breasts will continue to grow and be tender. Your gums may bleed and may be sensitive to brushing and flossing. Dark spots or blotches (chloasma, mask of pregnancy) may develop on your face. This will likely fade after the baby is born. A dark line from your belly button to the pubic area (linea nigra) may appear. This will likely fade after the  baby is born. You may have changes in your hair. These can include thickening of your hair, rapid growth, and changes in texture. Some women also have hair loss during or after pregnancy, or hair that feels dry or thin. Your hair will most likely return to normal after your baby is born. WHAT TO EXPECT AT YOUR PRENATAL VISITS During a routine prenatal visit: You will be weighed to make sure you and the fetus are growing normally. Your blood pressure will be taken. Your abdomen will be measured to track your baby's growth. The fetal heartbeat will be listened to. Any test results from the previous visit will be discussed. Your health care provider may ask you: How you are feeling. If you are feeling the baby move. If you have had any abnormal symptoms, such as leaking fluid, bleeding, severe headaches, or abdominal cramping. If you have any questions. Other tests that may be performed during your second  trimester include: Blood tests that check for: Low iron  levels (anemia). Gestational diabetes (between 24 and 28 weeks). Rh antibodies. Urine tests to check for infections, diabetes, or protein in the urine. An ultrasound to confirm the proper growth and development of the baby. An amniocentesis to check for possible genetic problems. Fetal screens for spina bifida and Down syndrome. HOME CARE INSTRUCTIONS  Avoid all smoking, herbs, alcohol, and unprescribed drugs. These chemicals affect the formation and growth of the baby. Follow your health care provider's instructions regarding medicine use. There are medicines that are either safe or unsafe to take during pregnancy. Exercise only as directed by your health care provider. Experiencing uterine cramps is a good sign to stop exercising. Continue to eat regular, healthy meals. Wear a good support bra for breast tenderness. Do not use hot tubs, steam rooms, or saunas. Wear your seat belt at all times when driving. Avoid raw meat, uncooked cheese, cat litter boxes, and soil used by cats. These carry germs that can cause birth defects in the baby. Take your prenatal vitamins. Try taking a stool softener (if your health care provider approves) if you develop constipation. Eat more high-fiber foods, such as fresh vegetables or fruit and whole grains. Drink plenty of fluids to keep your urine clear or pale yellow. Take warm sitz baths to soothe any pain or discomfort caused by hemorrhoids. Use hemorrhoid cream if your health care provider approves. If you develop varicose veins, wear support hose. Elevate your feet for 15 minutes, 3-4 times a day. Limit salt in your diet. Avoid heavy lifting, wear low heel shoes, and practice good posture. Rest with your legs elevated if you have leg cramps or low back pain. Visit your dentist if you have not gone yet during your pregnancy. Use a soft toothbrush to brush your teeth and be gentle when you floss. A  sexual relationship may be continued unless your health care provider directs you otherwise. Continue to go to all your prenatal visits as directed by your health care provider. SEEK MEDICAL CARE IF:  You have dizziness. You have mild pelvic cramps, pelvic pressure, or nagging pain in the abdominal area. You have persistent nausea, vomiting, or diarrhea. You have a bad smelling vaginal discharge. You have pain with urination. SEEK IMMEDIATE MEDICAL CARE IF:  You have a fever. You are leaking fluid from your vagina. You have spotting or bleeding from your vagina. You have severe abdominal cramping or pain. You have rapid weight gain or loss. You have shortness of breath with chest pain. You  notice sudden or extreme swelling of your face, hands, ankles, feet, or legs. You have not felt your baby move in over an hour. You have severe headaches that do not go away with medicine. You have vision changes. Document Released: 04/08/2001 Document Revised: 04/19/2013 Document Reviewed: 06/15/2012 [facility] Patient Information 2015 [place], [place]. This information is not intended to replace advice given to you by your health care provider. Make sure you discuss any questions you have with your health care provider.

## 2023-10-02 ENCOUNTER — Ambulatory Visit: Admitting: Obstetrics & Gynecology

## 2023-10-02 ENCOUNTER — Ambulatory Visit

## 2023-10-02 ENCOUNTER — Other Ambulatory Visit

## 2023-10-02 VITALS — BP 117/72 | HR 84 | Wt 212.0 lb

## 2023-10-02 DIAGNOSIS — Z3A28 28 weeks gestation of pregnancy: Secondary | ICD-10-CM

## 2023-10-02 DIAGNOSIS — O0993 Supervision of high risk pregnancy, unspecified, third trimester: Secondary | ICD-10-CM | POA: Diagnosis not present

## 2023-10-02 DIAGNOSIS — O10013 Pre-existing essential hypertension complicating pregnancy, third trimester: Secondary | ICD-10-CM

## 2023-10-02 DIAGNOSIS — O10912 Unspecified pre-existing hypertension complicating pregnancy, second trimester: Secondary | ICD-10-CM | POA: Diagnosis not present

## 2023-10-02 DIAGNOSIS — O10919 Unspecified pre-existing hypertension complicating pregnancy, unspecified trimester: Secondary | ICD-10-CM

## 2023-10-02 DIAGNOSIS — O0990 Supervision of high risk pregnancy, unspecified, unspecified trimester: Secondary | ICD-10-CM

## 2023-10-02 DIAGNOSIS — Z131 Encounter for screening for diabetes mellitus: Secondary | ICD-10-CM

## 2023-10-02 DIAGNOSIS — O0992 Supervision of high risk pregnancy, unspecified, second trimester: Secondary | ICD-10-CM | POA: Diagnosis not present

## 2023-10-02 LAB — POCT URINALYSIS DIPSTICK OB
Blood, UA: NEGATIVE
Nitrite, UA: NEGATIVE

## 2023-10-02 NOTE — Progress Notes (Signed)
 HIGH-RISK PREGNANCY VISIT Patient name: Latasha [name] MRN [id number]  Date of birth: [birth year] Chief Complaint:   Routine Prenatal Visit  History of Present Illness:   Latasha [name] is a 33 y.o. G56P1011 female at 101w2d with an Estimated Date of Delivery: 12/23/23 being seen today for ongoing management of a high-risk pregnancy complicated by: -Chronic HTN Taking labetalol  200 mg twice daily and doing well - Rubella nonimmune  Today she reports no complaints.   Contractions: Not present. Vag. Bleeding: None.  Movement: Present. denies leaking of fluid.      06/16/2023    9:40 AM 08/18/2022    2:18 PM 08/26/2021    9:32 AM 10/20/2019    8:41 AM 03/27/2017   10:10 AM  Depression screen PHQ 2/9  Decreased Interest 0 0 0 0 0  Down, Depressed, Hopeless 0 0 0 1 0  PHQ - 2 Score 0 0 0 1 0  Altered sleeping 0 1 0 1   Tired, decreased energy 0 0 0 2   Change in appetite 1 0 0 2   Feeling bad or failure about yourself  0 0 0 0   Trouble concentrating 0 0 0 0   Moving slowly or fidgety/restless 0 0 0 0   Suicidal thoughts 0 0 0 0   PHQ-9 Score 1 1 0 6   Difficult doing work/chores  Not difficult at all  Somewhat difficult      Current Outpatient Medications  Medication Instructions   albuterol  (PROVENTIL  HFA;VENTOLIN  HFA) 108 (90 BASE) MCG/ACT inhaler 2 puffs, Inhalation, Every 6 hours PRN   aspirin  EC 162 mg, Oral, Daily, Swallow whole.   Blood Pressure Monitor MISC For regular home bp monitoring during pregnancy   labetalol  (NORMODYNE ) 200 mg, Oral, 2 times daily   Prenatal MV & Min w/FA-DHA ([employer] PRENATAL GUMMY PO) Take by mouth.   promethazine  (PHENERGAN ) 12.5-25 mg, Oral, Every 6 hours PRN   silver  sulfADIAZINE  (SILVADENE ) 1 % cream 1 Application, Topical, Daily     Review of Systems:   Pertinent items are noted in HPI Denies abnormal vaginal discharge w/ itching/odor/irritation, headaches, visual changes, shortness of breath, chest pain, abdominal pain, severe  nausea/vomiting, or problems with urination or bowel movements unless otherwise stated above. Pertinent History Reviewed:  Reviewed past medical,surgical, social, obstetrical and family history.  Reviewed problem list, medications and allergies. Physical Assessment:   Vitals:   10/02/23 0951  BP: 117/72  Pulse: 84  Weight: 212 lb (96.2 kg)  Body mass index is 34.22 kg/m.           Physical Examination:   General appearance: alert, well appearing, and in no distress  Mental status: normal mood, behavior, speech, dress, motor activity, and thought processes  Skin: warm & dry   Extremities:      Cardiovascular: normal heart rate noted  Respiratory: normal respiratory effort, no distress  Abdomen: gravid, soft, non-tender  Pelvic: Cervical exam deferred         Fetal Status:     Movement: Present    Fetal Surveillance Testing today: cephalic,cx 2.7 cm,anterior placenta gr 0,normal ovaries,AFI 16 cm,FHR 164 bpm,EFW 1232 g 44%    Chaperone: N/A    Results for orders placed or performed in visit on 10/02/23 (from the past 24 hours)  POC Urinalysis Dipstick OB   Collection Time: 10/02/23  9:52 AM  Result Value Ref Range   Color, UA     Clarity, UA  Glucose, UA Moderate (2+) (A) Negative   Bilirubin, UA     Ketones, UA large    Spec Grav, UA     Blood, UA neg    pH, UA     POC,PROTEIN,UA Moderate (2+) Negative, Trace, Small (1+), Moderate (2+), Large (3+), 4+   Urobilinogen, UA     Nitrite, UA neg    Leukocytes, UA Small (1+) (A) Negative   Appearance     Odor       Assessment & Plan:  High-risk pregnancy: G3P1011 at [redacted]w[redacted]d with an Estimated Date of Delivery: 12/23/23   1. [redacted] weeks gestation of pregnancy -PN2 today - POC Urinalysis Dipstick OB  2. Supervision of high risk pregnancy, antepartum (Primary)  3. Chronic hypertension affecting pregnancy -Continue with current medication - Growth AGA, continue every 4 weeks - Antepartum screening to start at 32  weeks   Meds: No orders of the defined types were placed in this encounter.   Labs/procedures today: Growth scan  Treatment Plan: Routine OB care and as outlined above  Reviewed: Preterm labor symptoms and general obstetric precautions including but not limited to vaginal bleeding, contractions, leaking of fluid and fetal movement were reviewed in detail with the patient.  All questions were answered. Pt has home bp cuff. Check bp weekly, let us  know if >140/90.   Follow-up: Return in about 4 weeks (around 10/30/2023) for HROB visit and growth every 4wks and start twice weekly visits.   No future appointments.  Orders Placed This Encounter  Procedures   US  UA Cord Doppler   US  FETAL BPP WO NON STRESS   POC Urinalysis Dipstick OB    Keene Pastures, DO Attending Obstetrician & Gynecologist, Faculty Practice Center for [employer], [facility]

## 2023-10-02 NOTE — Progress Notes (Signed)
 US  28+2 wks,cephalic,cx 2.7 cm,anterior placenta gr 0,normal ovaries,AFI 16 cm,FHR 164 bpm,EFW 1232 g 44%

## 2023-10-03 ENCOUNTER — Ambulatory Visit: Payer: Self-pay | Admitting: Obstetrics & Gynecology

## 2023-10-03 LAB — GLUCOSE TOLERANCE, 2 HOURS W/ 1HR
Glucose, 1 hour: 176 mg/dL (ref 70–179)
Glucose, 2 hour: 107 mg/dL (ref 70–152)
Glucose, Fasting: 79 mg/dL (ref 70–91)

## 2023-10-03 LAB — ANTIBODY SCREEN: Antibody Screen: NEGATIVE

## 2023-10-03 LAB — CBC
Hematocrit: 31.2 % — ABNORMAL LOW (ref 34.0–46.6)
Hemoglobin: 10.2 g/dL — ABNORMAL LOW (ref 11.1–15.9)
MCH: 28.2 pg (ref 26.6–33.0)
MCHC: 32.7 g/dL (ref 31.5–35.7)
MCV: 86 fL (ref 79–97)
Platelets: 261 10*3/uL (ref 150–450)
RBC: 3.62 x10E6/uL — ABNORMAL LOW (ref 3.77–5.28)
RDW: 14.6 % (ref 11.7–15.4)
WBC: 6.8 10*3/uL (ref 3.4–10.8)

## 2023-10-03 LAB — RPR: RPR Ser Ql: NONREACTIVE

## 2023-10-03 LAB — HIV ANTIBODY (ROUTINE TESTING W REFLEX): HIV Screen 4th Generation wRfx: NONREACTIVE

## 2023-10-27 ENCOUNTER — Encounter: Payer: Self-pay | Admitting: Obstetrics & Gynecology

## 2023-10-27 ENCOUNTER — Ambulatory Visit: Admitting: Obstetrics & Gynecology

## 2023-10-27 ENCOUNTER — Other Ambulatory Visit

## 2023-10-27 VITALS — BP 114/70 | HR 97 | Wt 219.0 lb

## 2023-10-27 DIAGNOSIS — Z3A31 31 weeks gestation of pregnancy: Secondary | ICD-10-CM

## 2023-10-27 DIAGNOSIS — O99013 Anemia complicating pregnancy, third trimester: Secondary | ICD-10-CM

## 2023-10-27 DIAGNOSIS — O0993 Supervision of high risk pregnancy, unspecified, third trimester: Secondary | ICD-10-CM

## 2023-10-27 DIAGNOSIS — O10913 Unspecified pre-existing hypertension complicating pregnancy, third trimester: Secondary | ICD-10-CM | POA: Diagnosis not present

## 2023-10-27 DIAGNOSIS — O10919 Unspecified pre-existing hypertension complicating pregnancy, unspecified trimester: Secondary | ICD-10-CM

## 2023-10-27 DIAGNOSIS — Z23 Encounter for immunization: Secondary | ICD-10-CM | POA: Diagnosis not present

## 2023-10-27 DIAGNOSIS — O0992 Supervision of high risk pregnancy, unspecified, second trimester: Secondary | ICD-10-CM

## 2023-10-27 MED ORDER — FERROUS GLUCONATE 324 (38 FE) MG PO TABS: 324.0000 mg | ORAL_TABLET | ORAL | 1 refills | Status: DC

## 2023-10-27 NOTE — Progress Notes (Signed)
 US  31+6 wks,frank breech,anterior placenta gr 0,elevated UAD with EDF,RI .74,.78,.79,.77=98%,BPP 8/8,EFW 1819 g 34%,AFI 17%

## 2023-10-27 NOTE — Progress Notes (Signed)
 HIGH-RISK PREGNANCY VISIT Patient name: Latasha [name] MRN [id number]  Date of birth: 1990/10/19 Chief Complaint:   Routine Prenatal Visit  History of Present Illness:   Latasha [name] is a 33 y.o. G51P1011 female at [redacted]w[redacted]d with an Estimated Date of Delivery: 12/23/23 being seen today for ongoing management of a high-risk pregnancy complicated by:  -Chronic HTN -Anemia  Today she reports no complaints.   Contractions: Not present. Vag. Bleeding: None.  Movement: Present. denies leaking of fluid.      06/16/2023    9:40 AM 08/18/2022    2:18 PM 08/26/2021    9:32 AM 10/20/2019    8:41 AM 03/27/2017   10:10 AM  Depression screen PHQ 2/9  Decreased Interest 0 0 0 0 0  Down, Depressed, Hopeless 0 0 0 1 0  PHQ - 2 Score 0 0 0 1 0  Altered sleeping 0 1 0 1   Tired, decreased energy 0 0 0 2   Change in appetite 1 0 0 2   Feeling bad or failure about yourself  0 0 0 0   Trouble concentrating 0 0 0 0   Moving slowly or fidgety/restless 0 0 0 0   Suicidal thoughts 0 0 0 0   PHQ-9 Score 1 1 0 6   Difficult doing work/chores  Not difficult at all  Somewhat difficult      Current Outpatient Medications  Medication Instructions   albuterol  (PROVENTIL  HFA;VENTOLIN  HFA) 108 (90 BASE) MCG/ACT inhaler 2 puffs, Inhalation, Every 6 hours PRN   aspirin  EC 162 mg, Oral, Daily, Swallow whole.   Blood Pressure Monitor MISC For regular home bp monitoring during pregnancy   ferrous gluconate (FERGON) 324 mg, Oral, Every other day   labetalol  (NORMODYNE ) 200 mg, Oral, 2 times daily   Prenatal MV & Min w/FA-DHA ([employer] PRENATAL GUMMY PO) Take by mouth.   promethazine  (PHENERGAN ) 12.5-25 mg, Oral, Every 6 hours PRN   silver  sulfADIAZINE  (SILVADENE ) 1 % cream 1 Application, Topical, Daily     Review of Systems:   Pertinent items are noted in HPI Denies abnormal vaginal discharge w/ itching/odor/irritation, headaches, visual changes, shortness of breath, chest pain, abdominal pain, severe  nausea/vomiting, or problems with urination or bowel movements unless otherwise stated above. Pertinent History Reviewed:  Reviewed past medical,surgical, social, obstetrical and family history.  Reviewed problem list, medications and allergies. Physical Assessment:   Vitals:   10/27/23 1152  BP: 114/70  Pulse: 97  Weight: 219 lb (99.3 kg)  Body mass index is 35.35 kg/m.           Physical Examination:   General appearance: alert, well appearing, and in no distress  Mental status: normal mood, behavior, speech, dress, motor activity, and thought processes  Skin: warm & dry   Extremities: Edema: None    Cardiovascular: normal heart rate noted  Respiratory: normal respiratory effort, no distress  Abdomen: gravid, soft, non-tender  Pelvic: Cervical exam deferred         Fetal Status:     Movement: Present    Fetal Surveillance Testing today: frank breech,anterior placenta gr 0,elevated UAD with EDF,RI .74,.78,.79,.77=98%,BPP 8/8,EFW 1819 g 34%,AFI 17%    Chaperone: N/A    No results found for this or any previous visit (from the past 24 hours).   Assessment & Plan:  High-risk pregnancy: G3P1011 at [redacted]w[redacted]d with an Estimated Date of Delivery: 12/23/23   1) Chronic HTN -BPP 8/8, elevated dopplers notes, AGA growth -continue antepartum  testing -briefly discussed IOL 37-39  2) Anemia -oral iron  tablet  Meds:  Meds ordered this encounter  Medications   ferrous gluconate (FERGON) 324 MG tablet    Sig: Take 1 tablet (324 mg total) by mouth every other day.    Dispense:  90 tablet    Refill:  1    Labs/procedures today: BPP  Treatment Plan:  as outlined above and routine OB care  Reviewed: Preterm labor symptoms and general obstetric precautions including but not limited to vaginal bleeding, contractions, leaking of fluid and fetal movement were reviewed in detail with the patient.  All questions were answered. Pt has home bp cuff. Check bp weekly, let us  know if >140/90.    Follow-up: Return in about 2 weeks (around 11/10/2023) for continue growth every 4 wks and start NST/BPP twice weekly with HROB.   Future Appointments  Date Time Provider Department Center  11/03/2023  3:45 PM [facility] - FTOBGYN US  [facility] None  11/06/2023  8:50 AM [facility] NURSE [facility]  11/06/2023  9:10 AM Jayne Vonn DEL, MD [facility]  11/10/2023  2:15 PM [facility] - [facility]TO[facility]YN US  [facility] None  11/10/2023  3:10 PM Kizzie Suzen SAUNDERS, CNM [facility]  11/13/2023  9:50 AM [facility] NURSE [facility]  11/17/2023 10:30 AM [facility] - FTOBGYN US  [facility] None  11/17/2023 11:50 AM Jayne Vonn DEL, MD [facility]  11/20/2023 10:10 AM [facility] NURSE [facility]  11/24/2023 10:00 AM [facility] - [facility]TOBGYN US  [facility] None  11/24/2023 10:50 AM Kizzie Suzen SAUNDERS, CNM [facility]  11/27/2023  9:50 AM [facility] NURSE [facility]  12/01/2023  1:30 PM [facility] - FTOBGYN US  [facility] None  12/01/2023  2:30 PM Delores Nidia CROME, FNP [facility]  12/04/2023  9:50 AM [facility] NURSE [facility]  12/11/2023  9:50 AM [facility] NURSE [facility]  12/11/2023 10:10 AM Delores Nidia CROME, FNP [facility]  12/15/2023 10:00 AM [facility] - FTO[facility]YN US  [facility] None  12/15/2023 10:50 AM Jayne Vonn DEL, MD [facility]  12/18/2023  9:50 AM [facility] NURSE [facility]  12/22/2023 10:00 AM [facility] - FTO[facility]YN US  [facility] None  12/22/2023 10:50 AM Eure, Vonn DEL, MD [facility]    Orders Placed This Encounter  Procedures   Tdap vaccine greater than or equal to 7yo IM    Ajia Chadderdon, DO Attending Obstetrician & Gynecologist, [facility] [employer], [facility]

## 2023-11-03 ENCOUNTER — Other Ambulatory Visit

## 2023-11-03 DIAGNOSIS — Z3A32 32 weeks gestation of pregnancy: Secondary | ICD-10-CM | POA: Diagnosis not present

## 2023-11-03 DIAGNOSIS — O10913 Unspecified pre-existing hypertension complicating pregnancy, third trimester: Secondary | ICD-10-CM | POA: Diagnosis not present

## 2023-11-03 DIAGNOSIS — O10919 Unspecified pre-existing hypertension complicating pregnancy, unspecified trimester: Secondary | ICD-10-CM

## 2023-11-03 NOTE — Progress Notes (Addendum)
 US  32+6 wks,cephalic,BPP 8/8,anterior placenta gr 1,RI .72,.70,.72,.74=92%,FHR 144 bpm,AFI 16 cm

## 2023-11-06 ENCOUNTER — Ambulatory Visit

## 2023-11-06 ENCOUNTER — Ambulatory Visit: Admitting: Obstetrics & Gynecology

## 2023-11-06 ENCOUNTER — Encounter: Payer: Self-pay | Admitting: Obstetrics & Gynecology

## 2023-11-06 VITALS — BP 123/80 | HR 103 | Wt 207.2 lb

## 2023-11-06 DIAGNOSIS — O0993 Supervision of high risk pregnancy, unspecified, third trimester: Secondary | ICD-10-CM | POA: Diagnosis not present

## 2023-11-06 DIAGNOSIS — Z3A33 33 weeks gestation of pregnancy: Secondary | ICD-10-CM

## 2023-11-06 DIAGNOSIS — Z331 Pregnant state, incidental: Secondary | ICD-10-CM

## 2023-11-06 DIAGNOSIS — O10913 Unspecified pre-existing hypertension complicating pregnancy, third trimester: Secondary | ICD-10-CM

## 2023-11-06 DIAGNOSIS — Z1389 Encounter for screening for other disorder: Secondary | ICD-10-CM

## 2023-11-06 DIAGNOSIS — O10919 Unspecified pre-existing hypertension complicating pregnancy, unspecified trimester: Secondary | ICD-10-CM

## 2023-11-06 LAB — POCT URINALYSIS DIPSTICK OB
Blood, UA: NEGATIVE
Glucose, UA: NEGATIVE
Nitrite, UA: NEGATIVE

## 2023-11-06 NOTE — Progress Notes (Signed)
 HIGH-RISK PREGNANCY VISIT Patient name: Latasha [name] MRN [id number]  Date of birth: [birth year] Chief Complaint:   Non-stress Test and Routine Prenatal Visit  History of Present Illness:   Latasha [name] is a 33 y.o. G42P1011 female at [redacted]w[redacted]d with an Estimated Date of Delivery: 12/23/23 being seen today for ongoing management of a high-risk pregnancy complicated by     ICD-10-CM   1. Supervision of high risk pregnancy in third trimester  O09.93     2. [redacted] weeks gestation of pregnancy  Z3A.33     3. Chronic hypertension affecting pregnancy: Labetalol  200 BID  O10.919      .    Today she reports no complaints. Contractions: Not present. Vag. Bleeding: None.  Movement: Present. denies leaking of fluid.      06/16/2023    9:40 AM 08/18/2022    2:18 PM 08/26/2021    9:32 AM 10/20/2019    8:41 AM 03/27/2017   10:10 AM  Depression screen PHQ 2/9  Decreased Interest 0 0 0 0 0  Down, Depressed, Hopeless 0 0 0 1 0  PHQ - 2 Score 0 0 0 1 0  Altered sleeping 0 1 0 1   Tired, decreased energy 0 0 0 2   Change in appetite 1 0 0 2   Feeling bad or failure about yourself  0 0 0 0   Trouble concentrating 0 0 0 0   Moving slowly or fidgety/restless 0 0 0 0   Suicidal thoughts 0 0 0 0   PHQ-9 Score 1 1 0 6   Difficult doing work/chores  Not difficult at all  Somewhat difficult         06/16/2023    9:40 AM 08/18/2022    2:18 PM 08/26/2021    9:32 AM 10/20/2019    8:42 AM  GAD 7 : Generalized Anxiety Score  Nervous, Anxious, on Edge 0 0 0 0  Control/stop worrying 0 0 0 0  Worry too much - different things 0 0 0 1  Trouble relaxing 0 0 0 1  Restless 0 0 0 0  Easily annoyed or irritable 0 0 0 1  Afraid - awful might happen 0 0 0 0  Total GAD 7 Score 0 0 0 3  Anxiety Difficulty  Not difficult at all  Not difficult at all     Review of Systems:   Pertinent items are noted in HPI Denies abnormal vaginal discharge w/ itching/odor/irritation, headaches, visual changes, shortness of  breath, chest pain, abdominal pain, severe nausea/vomiting, or problems with urination or bowel movements unless otherwise stated above. Pertinent History Reviewed:  Reviewed past medical,surgical, social, obstetrical and family history.  Reviewed problem list, medications and allergies. Physical Assessment:   Vitals:   11/06/23 0920  BP: 123/80  Pulse: (!) 103  Weight: 207 lb 3.2 oz (94 kg)  Body mass index is 33.44 kg/m.           Physical Examination:   General appearance: alert, well appearing, and in no distress  Mental status: alert, oriented to person, place, and time  Skin: warm & dry   Extremities: Edema: None    Cardiovascular: normal heart rate noted  Respiratory: normal respiratory effort, no distress  Abdomen: gravid, soft, non-tender  Pelvic: Cervical exam deferred         Fetal Status:     Movement: Present    Fetal Surveillance Testing today: Reactive NST  Idelle D Janssen is at  [redacted]w[redacted]d Estimated Date of Delivery: 12/23/23  NST being performed due to cHTN  Today the NST is Reactive  Fetal Monitoring:  Baseline: 140 bpm, Variability: Good {> 6 bpm), Accelerations: Reactive, and Decelerations: Absent   reactive  The accelerations are >15 bpm and more than 2 in 20 minutes  Final diagnosis:  Reactive NST  Vonn VEAR Inch, MD     Chaperone: N/A    Results for orders placed or performed in visit on 11/06/23 (from the past 24 hours)  POC Urinalysis Dipstick OB   Collection Time: 11/06/23  9:49 AM  Result Value Ref Range   Color, UA     Clarity, UA     Glucose, UA Negative Negative   Bilirubin, UA     Ketones, UA trace    Spec Grav, UA     Blood, UA negative    pH, UA     POC,PROTEIN,UA Small (1+) Negative, Trace, Small (1+), Moderate (2+), Large (3+), 4+   Urobilinogen, UA     Nitrite, UA negative    Leukocytes, UA Trace (A) Negative   Appearance     Odor      Assessment & Plan:  High-risk pregnancy: G3P1011 at [redacted]w[redacted]d with an Estimated Date of  Delivery: 12/23/23      ICD-10-CM   1. Supervision of high risk pregnancy in third trimester  O09.93     2. [redacted] weeks gestation of pregnancy  Z3A.33     3. Chronic hypertension affecting pregnancy: Labetalol  200 BID  O10.919          Meds: No orders of the defined types were placed in this encounter.   Orders: No orders of the defined types were placed in this encounter.    Labs/procedures today: NST  Treatment Plan:  sign BTL papers today, keep scheduled visits and surveillance schedule  Reviewed: Preterm labor symptoms and general obstetric precautions including but not limited to vaginal bleeding, contractions, leaking of fluid and fetal movement were reviewed in detail with the patient.  All questions were answered. Does have home bp cuff. Office bp cuff given: not applicable. Check bp daily, let us  know if consistently >140 and/or >90.  Follow-up: No follow-ups on file.   Future Appointments  Date Time Provider Department Center  11/10/2023  2:15 PM [facility] - FTOBGYN US  [facility] None  11/10/2023  3:10 PM Inch Vonn VEAR, MD [facility]YN  11/13/2023  9:50 AM [facility] NURSE [facility]  11/17/2023 10:30 AM [facility] - FTOBGYN US  [facility] None  11/17/2023 11:50 AM Inch Vonn VEAR, MD [facility]YN  11/20/2023 10:10 AM [facility] NURSE [facility]  11/24/2023 10:00 AM [facility] - FTOBGYN US  [facility] None  11/24/2023 10:50 AM Kizzie Suzen SAUNDERS, CNM [facility]  11/27/2023  9:50 AM [facility] NURSE [facility]  12/01/2023  1:30 PM [facility] - FTO[facility]YN US  [facility] None  12/01/2023  2:30 PM Delores Nidia CROME, FNP [facility]  12/04/2023  9:50 AM [facility] NURSE [facility]  12/11/2023  9:50 AM [facility] NURSE [facility]  12/11/2023 10:10 AM Inch Vonn VEAR, MD [facility]  12/15/2023 10:00 AM [facility] - FTO[facility]YN US  [facility] None  12/15/2023 10:50 AM Inch Vonn VEAR, MD [facility]  12/18/2023  9:50 AM [facility] NURSE [facility]  12/22/2023 10:00 AM [facility] - FTO[facility]YN US  [facility]  None  12/22/2023 10:50 AM Inch Vonn VEAR, MD [facility]    No orders of the defined types were placed in this encounter.  Vonn VEAR Inch  Attending Physician for  the [facility] for [facility] 11/06/2023 9:54 AM

## 2023-11-10 ENCOUNTER — Encounter: Payer: Self-pay | Admitting: Obstetrics & Gynecology

## 2023-11-10 ENCOUNTER — Ambulatory Visit (INDEPENDENT_AMBULATORY_CARE_PROVIDER_SITE_OTHER): Admitting: Obstetrics & Gynecology

## 2023-11-10 ENCOUNTER — Ambulatory Visit (INDEPENDENT_AMBULATORY_CARE_PROVIDER_SITE_OTHER)

## 2023-11-10 VITALS — BP 127/70 | HR 76 | Wt 215.0 lb

## 2023-11-10 DIAGNOSIS — O0993 Supervision of high risk pregnancy, unspecified, third trimester: Secondary | ICD-10-CM

## 2023-11-10 DIAGNOSIS — O10913 Unspecified pre-existing hypertension complicating pregnancy, third trimester: Secondary | ICD-10-CM

## 2023-11-10 DIAGNOSIS — O10919 Unspecified pre-existing hypertension complicating pregnancy, unspecified trimester: Secondary | ICD-10-CM

## 2023-11-10 DIAGNOSIS — Z3A33 33 weeks gestation of pregnancy: Secondary | ICD-10-CM | POA: Diagnosis not present

## 2023-11-10 NOTE — Progress Notes (Signed)
 US  33+6 wks,cephalic,BPP 8/8,FHR 148 bpm,anterior placenta gr 2,RI .60,.64,.59,.58=55%,AFI 18%

## 2023-11-10 NOTE — Progress Notes (Signed)
 HIGH-RISK PREGNANCY VISIT Patient name: Latasha [name] MRN [id number]  Date of birth: 1990-08-22 Chief Complaint:   No chief complaint on file.  History of Present Illness:   Latasha [name] is a 33 y.o. G17P1011 female at [redacted]w[redacted]d with an Estimated Date of Delivery: 12/23/23 being seen today for ongoing management of a high-risk pregnancy complicated by     ICD-10-CM   1. Supervision of high risk pregnancy in third trimester  O09.93     2. Chronic hypertension affecting pregnancy: Labetalol  200 BID  O10.919      .    Today she reports no complaints. Contractions: Not present. Vag. Bleeding: None.  Movement: Present. denies leaking of fluid.      06/16/2023    9:40 AM 08/18/2022    2:18 PM 08/26/2021    9:32 AM 10/20/2019    8:41 AM 03/27/2017   10:10 AM  Depression screen PHQ 2/9  Decreased Interest 0 0 0 0 0  Down, Depressed, Hopeless 0 0 0 1 0  PHQ - 2 Score 0 0 0 1 0  Altered sleeping 0 1 0 1   Tired, decreased energy 0 0 0 2   Change in appetite 1 0 0 2   Feeling bad or failure about yourself  0 0 0 0   Trouble concentrating 0 0 0 0   Moving slowly or fidgety/restless 0 0 0 0   Suicidal thoughts 0 0 0 0   PHQ-9 Score 1 1 0 6   Difficult doing work/chores  Not difficult at all  Somewhat difficult         06/16/2023    9:40 AM 08/18/2022    2:18 PM 08/26/2021    9:32 AM 10/20/2019    8:42 AM  GAD 7 : Generalized Anxiety Score  Nervous, Anxious, on Edge 0 0 0 0  Control/stop worrying 0 0 0 0  Worry too much - different things 0 0 0 1  Trouble relaxing 0 0 0 1  Restless 0 0 0 0  Easily annoyed or irritable 0 0 0 1  Afraid - awful might happen 0 0 0 0  Total GAD 7 Score 0 0 0 3  Anxiety Difficulty  Not difficult at all  Not difficult at all     Review of Systems:   Pertinent items are noted in HPI Denies abnormal vaginal discharge w/ itching/odor/irritation, headaches, visual changes, shortness of breath, chest pain, abdominal pain, severe nausea/vomiting, or  problems with urination or bowel movements unless otherwise stated above. Pertinent History Reviewed:  Reviewed past medical,surgical, social, obstetrical and family history.  Reviewed problem list, medications and allergies. Physical Assessment:   Vitals:   11/10/23 1448  BP: 127/70  Pulse: 76  Weight: 215 lb (97.5 kg)  Body mass index is 34.7 kg/m.           Physical Examination:   General appearance: alert, well appearing, and in no distress  Mental status: alert, oriented to person, place, and time  Skin: warm & dry   Extremities:      Cardiovascular: normal heart rate noted  Respiratory: normal respiratory effort, no distress  Abdomen: gravid, soft, non-tender  Pelvic: Cervical exam deferred         Fetal Status:     Movement: Present    Fetal Surveillance Testing today: BPP 8/8 normal UAD   Chaperone: N/A    No results found for this or any previous visit (from the past 24  hours).  Assessment & Plan:  High-risk pregnancy: G3P1011 at [redacted]w[redacted]d with an Estimated Date of Delivery: 12/23/23      ICD-10-CM   1. Supervision of high risk pregnancy in third trimester  O09.93     2. Chronic hypertension affecting pregnancy: Labetalol  200 BID  O10.919          Meds: No orders of the defined types were placed in this encounter.   Orders: No orders of the defined types were placed in this encounter.    Labs/procedures today: U/S  Treatment Plan:  twice weekly surveillance  Reviewed: Preterm labor symptoms and general obstetric precautions including but not limited to vaginal bleeding, contractions, leaking of fluid and fetal movement were reviewed in detail with the patient.  All questions were answered. Does have home bp cuff. Office bp cuff given: not applicable. Check bp daily, let us  know if consistently >140 and/or >90.  Follow-up: Return for keep scheduled.   Future Appointments  Date Time Provider Department Center  11/10/2023  3:10 PM Jayne Vonn DEL, MD [facility]  [facility]  11/13/2023  9:50 AM [facility] NURSE [facility] FTOBGYN  11/17/2023 10:30 AM [facility] - FTOBGYN US  [facility] None  11/17/2023 11:50 AM Jayne Vonn DEL, MD [facility] FTO[facility]YN  11/20/2023 10:10 AM [facility] NURSE [facility]  11/24/2023 10:00 AM [facility] - FTOBGYN US  [facility] None  11/24/2023 10:50 AM Kizzie Suzen SAUNDERS, CNM [facility]  11/27/2023  9:50 AM [facility] NURSE [facility]  12/01/2023  1:30 PM [facility] - FTO[facility]YN US  [facility] None  12/01/2023  2:30 PM Delores Nidia CROME, FNP [facility]  12/04/2023  9:50 AM [facility] NURSE [facility]  12/11/2023  9:50 AM [facility] NURSE [facility]  12/11/2023 10:10 AM Jayne Vonn DEL, MD [facility]  12/15/2023 10:00 AM [facility] - FTOBGYN US  [facility] None  12/15/2023 10:50 AM Jayne Vonn DEL, MD [facility]  12/18/2023  9:50 AM [facility] NURSE [facility]  12/22/2023 10:00 AM [facility] - FTOBGYN US  [facility] None  12/22/2023 10:50 AM Jayne Vonn DEL, MD [facility] FTO[facility]YN    No orders of the defined types were placed in this encounter.  Vonn DEL Jayne  Attending Physician for the Center for [facility] 11/10/2023 3:03 PM

## 2023-11-13 ENCOUNTER — Ambulatory Visit (INDEPENDENT_AMBULATORY_CARE_PROVIDER_SITE_OTHER): Admitting: *Deleted

## 2023-11-13 DIAGNOSIS — O0993 Supervision of high risk pregnancy, unspecified, third trimester: Secondary | ICD-10-CM | POA: Diagnosis not present

## 2023-11-13 DIAGNOSIS — Z3A34 34 weeks gestation of pregnancy: Secondary | ICD-10-CM

## 2023-11-13 NOTE — Progress Notes (Signed)
   NURSE VISIT- NST  SUBJECTIVE:  Latasha [name] is a 33 y.o. G17P1011 female at [redacted]w[redacted]d, here for a NST for pregnancy complicated by [facility].  She reports active fetal movement, contractions: none, vaginal bleeding: none, membranes: intact.   OBJECTIVE:  BP 110/72   Pulse 77   Wt 219 lb 6.4 oz (99.5 kg)   LMP  (LMP Unknown)   BMI 35.41 kg/m   Appears well, no apparent distress  No results found for this or any previous visit (from the past 24 hours).  NST: FHR baseline 130 bpm, Variability: moderate, Accelerations:present, Decelerations:  Absent= Cat 1/reactive Toco: none   ASSESSMENT: G3P1011 at [redacted]w[redacted]d with CHTN NST reactive  PLAN: EFM strip reviewed by Dr. Ozan   Recommendations: keep next appointment as scheduled    Alan LITTIE Fischer  11/13/2023 12:03 PM

## 2023-11-17 ENCOUNTER — Ambulatory Visit (INDEPENDENT_AMBULATORY_CARE_PROVIDER_SITE_OTHER)

## 2023-11-17 ENCOUNTER — Encounter: Payer: Self-pay | Admitting: Women's Health

## 2023-11-17 ENCOUNTER — Ambulatory Visit: Admitting: Women's Health

## 2023-11-17 VITALS — BP 117/76 | HR 63 | Wt 218.0 lb

## 2023-11-17 DIAGNOSIS — O0993 Supervision of high risk pregnancy, unspecified, third trimester: Secondary | ICD-10-CM | POA: Diagnosis not present

## 2023-11-17 DIAGNOSIS — O10919 Unspecified pre-existing hypertension complicating pregnancy, unspecified trimester: Secondary | ICD-10-CM

## 2023-11-17 DIAGNOSIS — O10913 Unspecified pre-existing hypertension complicating pregnancy, third trimester: Secondary | ICD-10-CM

## 2023-11-17 DIAGNOSIS — Z3A34 34 weeks gestation of pregnancy: Secondary | ICD-10-CM | POA: Diagnosis not present

## 2023-11-17 DIAGNOSIS — O0992 Supervision of high risk pregnancy, unspecified, second trimester: Secondary | ICD-10-CM

## 2023-11-17 NOTE — Progress Notes (Signed)
 HIGH-RISK PREGNANCY VISIT Patient name: Latasha [name] MRN [id number]  Date of birth: 13-Mar-1991 Chief Complaint:   Routine Prenatal Visit  History of Present Illness:   Latasha [name] is a 33 y.o. G53P1011 female at [redacted]w[redacted]d with an Estimated Date of Delivery: 12/23/23 being seen today for ongoing management of a high-risk pregnancy complicated by chronic hypertension currently on labetalol  200mg  BID.    Today she reports no complaints. Contractions: Not present. Vag. Bleeding: None.  Movement: Present. denies leaking of fluid.      06/16/2023    9:40 AM 08/18/2022    2:18 PM 08/26/2021    9:32 AM 10/20/2019    8:41 AM 03/27/2017   10:10 AM  Depression screen PHQ 2/9  Decreased Interest 0 0 0 0 0  Down, Depressed, Hopeless 0 0 0 1 0  PHQ - 2 Score 0 0 0 1 0  Altered sleeping 0 1 0 1   Tired, decreased energy 0 0 0 2   Change in appetite 1 0 0 2   Feeling bad or failure about yourself  0 0 0 0   Trouble concentrating 0 0 0 0   Moving slowly or fidgety/restless 0 0 0 0   Suicidal thoughts 0 0 0 0   PHQ-9 Score 1 1 0 6   Difficult doing work/chores  Not difficult at all  Somewhat difficult         06/16/2023    9:40 AM 08/18/2022    2:18 PM 08/26/2021    9:32 AM 10/20/2019    8:42 AM  GAD 7 : Generalized Anxiety Score  Nervous, Anxious, on Edge 0 0 0 0  Control/stop worrying 0 0 0 0  Worry too much - different things 0 0 0 1  Trouble relaxing 0 0 0 1  Restless 0 0 0 0  Easily annoyed or irritable 0 0 0 1  Afraid - awful might happen 0 0 0 0  Total GAD 7 Score 0 0 0 3  Anxiety Difficulty  Not difficult at all  Not difficult at all     Review of Systems:   Pertinent items are noted in HPI Denies abnormal vaginal discharge w/ itching/odor/irritation, headaches, visual changes, shortness of breath, chest pain, abdominal pain, severe nausea/vomiting, or problems with urination or bowel movements unless otherwise stated above. Pertinent History Reviewed:  Reviewed past  medical,surgical, social, obstetrical and family history.  Reviewed problem list, medications and allergies. Physical Assessment:   Vitals:   11/17/23 1100  BP: 117/76  Pulse: 63  Weight: 218 lb (98.9 kg)  Body mass index is 35.19 kg/m.           Physical Examination:   General appearance: alert, well appearing, and in no distress  Mental status: alert, oriented to person, place, and time  Skin: warm & dry   Extremities:      Cardiovascular: normal heart rate noted  Respiratory: normal respiratory effort, no distress  Abdomen: gravid, soft, non-tender  Pelvic: Cervical exam deferred         Fetal Status:     Movement: Present    Fetal Surveillance Testing today: US  34+6 wks,cephalic,BPP 8/8,anterior placenta gr 3,AFI 19 cm,FHR 132 bpm,elevated UAD with EDF,RI .70,.73,.75=96%   Chaperone: N/A  No results found for this or any previous visit (from the past 24 hours).  Assessment & Plan:  High-risk pregnancy: G3P1011 at [redacted]w[redacted]d with an Estimated Date of Delivery: 12/23/23   1) CHTN, stable on labetalol   200mg  BID, ASA. Did not void today  2) Elevated UAD, w/ good EDF  Meds: No orders of the defined types were placed in this encounter.   Labs/procedures today: U/S  Treatment Plan:   EFW q 4w  2x/wk testing nst/sono      Deliver 37-39.0wks (or as per MFM w/ poor control)____   Reviewed: Preterm labor symptoms and general obstetric precautions including but not limited to vaginal bleeding, contractions, leaking of fluid and fetal movement were reviewed in detail with the patient.  All questions were answered. Does have home bp cuff. Office bp cuff given: not applicable. Check bp daily, let us  know if consistently >140 and/or >90 or pre-e s/s.  Follow-up: Return for As scheduled.   Future Appointments  Date Time Provider Department Center  11/17/2023 11:50 AM Kizzie Suzen SAUNDERS, CNM [facility]YN  11/20/2023 10:10 AM [facility] NURSE [facility]  11/24/2023 10:00 AM [facility] -  FTOBGYN US  [facility] None  11/24/2023 10:50 AM Kizzie Suzen SAUNDERS, CNM [facility]  11/27/2023  9:50 AM [facility] NURSE [facility]YN  12/01/2023  1:30 PM [facility] - FTOBGYN US  [facility] None  12/01/2023  2:30 PM Delores Nidia CROME, FNP [facility] FTO[facility]YN  12/04/2023  9:50 AM [facility] NURSE [facility]  12/08/2023  9:50 AM [facility] NURSE [facility]  12/11/2023 10:45 AM [facility] - FTOBGYN US  [facility] None  12/11/2023 11:30 AM Delores Nidia CROME, FNP [facility]  12/15/2023 10:00 AM [facility] - FTOBGYN US  [facility] None  12/15/2023 10:50 AM Marilynn Nest, DO [facility] F[facility]  12/18/2023  9:50 AM [facility] NURSE [facility]  12/22/2023 10:00 AM [facility] - FTOBGYN US  [facility] None  12/22/2023 10:50 AM Jayne Vonn DEL, MD [facility]YN    No orders of the defined types were placed in this encounter.  Suzen SAUNDERS Kizzie CNM, [facility] 11/17/2023 11:17 AM

## 2023-11-17 NOTE — Progress Notes (Signed)
 US  34+6 wks,cephalic,BPP 8/8,anterior placenta gr 3,AFI 19 cm,FHR 132 bpm,elevated UAD with EDF,RI .70,.73,.75=96%

## 2023-11-17 NOTE — Patient Instructions (Signed)
 Jahnay, thank you for choosing our office today! We appreciate the opportunity to meet your healthcare needs. You may receive a short survey by mail, e-mail, or through [employer]. If you are happy with your care we would appreciate if you could take just a few minutes to complete the survey questions. We read all of your comments and take your feedback very seriously. Thank you again for choosing our office.  Center for [employer] Team at [facility]  [facility] at [facility] ([address] [place], [place] [address]) Entrance C, located off of [address] Free 24/7 valet parking   CLASSES: Go to [email or web address] to register for classes (childbirth, breastfeeding, waterbirth, infant CPR, daddy bootcamp, etc.)  Call the office [phone]) or go to [facility] if: You begin to have strong, frequent contractions Your water breaks.  Sometimes it is a big gush of fluid, sometimes it is just a trickle that keeps getting your panties wet or running down your legs You have vaginal bleeding.  It is normal to have a small amount of spotting if your cervix was checked.  You don't feel your baby moving like normal.  If you don't, get you something to eat and drink and lay down and focus on feeling your baby move.   If your baby is still not moving like normal, you should call the office or go to [facility].  Call the office [phone]) or go to [facility] hospital for these signs of pre-eclampsia: Severe headache that does not go away with Tylenol  Visual changes- seeing spots, double, blurred vision Pain under your right breast or upper abdomen that does not go away with Tums or heartburn medicine Nausea and/or vomiting Severe swelling in your hands, feet, and face   Tdap Vaccine It is recommended that you get the Tdap vaccine during the third trimester of EACH pregnancy to help protect your baby from getting pertussis (whooping cough) 27-36 weeks is the BEST time to do  this so that you can pass the protection on to your baby. During pregnancy is better than after pregnancy, but if you are unable to get it during pregnancy it will be offered at the hospital.  You can get this vaccine with us , at the health department, your family doctor, or some local pharmacies Everyone who will be around your baby should also be up-to-date on their vaccines before the baby comes. Adults (who are not pregnant) only need 1 dose of Tdap during adulthood.   [facility] Pediatricians/Family Doctors [facility]): [address] Luba BROCKS, [phone]           [facility]: [address], [phone]                [facility] Medicine [facility]): [address], [phone] (call to ask if accepting patients) [facility] Department: [address], [place], [phone]    [facility] Pediatricians/Family Doctors [facility]): [phone] [address], [phone] [facility]: [address] [place], [phone] [facility]: [address], [phone]  [facility] Doctors  [facility]): [phone] [facility]: [address] [phone]   [facility] Doctors [facility]: [address] [phone]  [facility] Family Doctors  [facility]: [phone], [phone]  Home Blood Pressure Monitoring for Patients   Your provider has recommended that you check your  blood pressure (BP) at least once a week at home. If you do not have a blood pressure cuff at home, one will be provided for you. Contact your provider if you have not received your monitor within 1 week.   Helpful Tips for Accurate Home Blood Pressure Checks  Don't smoke, exercise, or drink caffeine 30 minutes before checking your BP Use the restroom before checking your BP (a full bladder can raise your  pressure) Relax in a comfortable upright chair Feet on the ground Left arm resting comfortably on a flat surface at the level of your heart Legs uncrossed Back supported Sit quietly and don't talk Place the cuff on your bare arm Adjust snuggly, so that only two fingertips can fit between your skin and the top of the cuff Check 2 readings separated by at least one minute Keep a log of your BP readings For a visual, please reference this diagram: http://ccnc.care/bpdiagram  Provider Name: [facility]     Phone: [phone]  Zone 1: ALL CLEAR  Continue to monitor your symptoms:  BP reading is less than 140 (top number) or less than 90 (bottom number)  No right upper stomach pain No headaches or seeing spots No feeling nauseated or throwing up No swelling in face and hands  Zone 2: CAUTION Call your doctor's office for any of the following:  BP reading is greater than 140 (top number) or greater than 90 (bottom number)  Stomach pain under your ribs in the middle or right side Headaches or seeing spots Feeling nauseated or throwing up Swelling in face and hands  Zone 3: EMERGENCY  Seek immediate medical care if you have any of the following:  BP reading is greater than160 (top number) or greater than 110 (bottom number) Severe headaches not improving with Tylenol  Serious difficulty catching your breath Any worsening symptoms from Zone 2  Preterm Labor and Birth Information  The normal length of a pregnancy is 39-41 weeks. Preterm labor is when labor starts before 37 completed weeks of pregnancy. What are the risk factors for preterm labor? Preterm labor is more likely to occur in women who: Have certain infections during pregnancy such as a bladder infection, sexually transmitted infection, or infection inside the uterus (chorioamnionitis). Have a shorter-than-normal cervix. Have gone into preterm labor before. Have had surgery on their cervix. Are younger than age 16  or older than age 24. Are African American. Are pregnant with twins or multiple babies (multiple gestation). Take street drugs or smoke while pregnant. Do not gain enough weight while pregnant. Became pregnant shortly after having been pregnant. What are the symptoms of preterm labor? Symptoms of preterm labor include: Cramps similar to those that can happen during a menstrual period. The cramps may happen with diarrhea. Pain in the abdomen or lower back. Regular uterine contractions that may feel like tightening of the abdomen. A feeling of increased pressure in the pelvis. Increased watery or bloody mucus discharge from the vagina. Water breaking (ruptured amniotic sac). Why is it important to recognize signs of preterm labor? It is important to recognize signs of preterm labor because babies who are born prematurely may not be fully developed. This can put them at an increased risk for: Long-term (chronic) heart and lung problems. Difficulty immediately after birth with regulating body systems, including blood sugar, body temperature, heart rate, and breathing rate. Bleeding in the brain. Cerebral palsy. Learning difficulties. Death. These risks are highest for babies who are born before 34 weeks  of pregnancy. How is preterm labor treated? Treatment depends on the length of your pregnancy, your condition, and the health of your baby. It may involve: Having a stitch (suture) placed in your cervix to prevent your cervix from opening too early (cerclage). Taking or being given medicines, such as: Hormone medicines. These may be given early in pregnancy to help support the pregnancy. Medicine to stop contractions. Medicines to help mature the baby's lungs. These may be prescribed if the risk of delivery is high. Medicines to prevent your baby from developing cerebral palsy. If the labor happens before 34 weeks of pregnancy, you may need to stay in the hospital. What should I do if I  think I am in preterm labor? If you think that you are going into preterm labor, call your health care provider right away. How can I prevent preterm labor in future pregnancies? To increase your chance of having a full-term pregnancy: Do not use any tobacco products, such as cigarettes, chewing tobacco, and e-cigarettes. If you need help quitting, ask your health care provider. Do not use street drugs or medicines that have not been prescribed to you during your pregnancy. Talk with your health care provider before taking any herbal supplements, even if you have been taking them regularly. Make sure you gain a healthy amount of weight during your pregnancy. Watch for infection. If you think that you might have an infection, get it checked right away. Make sure to tell your health care provider if you have gone into preterm labor before. This information is not intended to replace advice given to you by your health care provider. Make sure you discuss any questions you have with your health care provider. Document Revised: 08/06/2018 Document Reviewed: 09/05/2015 Elsevier Patient Education  2020 [employer].

## 2023-11-20 ENCOUNTER — Ambulatory Visit (INDEPENDENT_AMBULATORY_CARE_PROVIDER_SITE_OTHER): Admitting: *Deleted

## 2023-11-20 VITALS — BP 114/72 | HR 76

## 2023-11-20 DIAGNOSIS — O0993 Supervision of high risk pregnancy, unspecified, third trimester: Secondary | ICD-10-CM

## 2023-11-20 DIAGNOSIS — Z3A35 35 weeks gestation of pregnancy: Secondary | ICD-10-CM | POA: Diagnosis not present

## 2023-11-20 DIAGNOSIS — I10 Essential (primary) hypertension: Secondary | ICD-10-CM | POA: Diagnosis not present

## 2023-11-20 NOTE — Progress Notes (Signed)
   NURSE VISIT- NST  SUBJECTIVE:  Latasha [name] is a 33 y.o. G55P1011 female at [redacted]w[redacted]d, here for a NST for pregnancy complicated by [facility].  She reports active fetal movement, contractions: none, vaginal bleeding: none, membranes: intact.   OBJECTIVE:  BP 114/72   Pulse 76   LMP  (LMP Unknown)   Appears well, no apparent distress  No results found for this or any previous visit (from the past 24 hours).  NST: FHR baseline 130 bpm, Variability: moderate, Accelerations:present, Decelerations:  Absent= Cat 1/reactive Toco: none   ASSESSMENT: G3P1011 at [redacted]w[redacted]d with CHTN NST reactive  PLAN: EFM strip reviewed by Dr. Ozan   Recommendations: keep next appointment as scheduled    Alicia Ackert  11/20/2023 10:57 AM

## 2023-11-24 ENCOUNTER — Encounter: Payer: Self-pay | Admitting: Women's Health

## 2023-11-24 ENCOUNTER — Ambulatory Visit: Admitting: Women's Health

## 2023-11-24 ENCOUNTER — Ambulatory Visit (INDEPENDENT_AMBULATORY_CARE_PROVIDER_SITE_OTHER)

## 2023-11-24 ENCOUNTER — Other Ambulatory Visit (HOSPITAL_COMMUNITY)
Admission: RE | Admit: 2023-11-24 | Discharge: 2023-11-24 | Disposition: A | Discharge status: Home / Self Care | Source: Ambulatory Visit | Attending: Women's Health | Admitting: Women's Health

## 2023-11-24 VITALS — BP 126/85 | HR 80 | Wt 219.0 lb

## 2023-11-24 DIAGNOSIS — O10919 Unspecified pre-existing hypertension complicating pregnancy, unspecified trimester: Secondary | ICD-10-CM

## 2023-11-24 DIAGNOSIS — Z331 Pregnant state, incidental: Secondary | ICD-10-CM

## 2023-11-24 DIAGNOSIS — Z1389 Encounter for screening for other disorder: Secondary | ICD-10-CM

## 2023-11-24 DIAGNOSIS — O0993 Supervision of high risk pregnancy, unspecified, third trimester: Secondary | ICD-10-CM | POA: Diagnosis not present

## 2023-11-24 DIAGNOSIS — Z3A35 35 weeks gestation of pregnancy: Secondary | ICD-10-CM | POA: Insufficient documentation

## 2023-11-24 DIAGNOSIS — O10913 Unspecified pre-existing hypertension complicating pregnancy, third trimester: Secondary | ICD-10-CM

## 2023-11-24 DIAGNOSIS — O0992 Supervision of high risk pregnancy, unspecified, second trimester: Secondary | ICD-10-CM

## 2023-11-24 LAB — POCT URINALYSIS DIPSTICK OB
Blood, UA: NEGATIVE
Glucose, UA: NEGATIVE
Ketones, UA: NEGATIVE
Nitrite, UA: NEGATIVE
POC,PROTEIN,UA: NEGATIVE

## 2023-11-24 NOTE — Patient Instructions (Signed)
 Latasha [name], thank you for choosing our office today! We appreciate the opportunity to meet your healthcare needs. You may receive a short survey by mail, e-mail, or through [employer]. If you are happy with your care we would appreciate if you could take just a few minutes to complete the survey questions. We read all of your comments and take your feedback very seriously. Thank you again for choosing our office.  Center for [employer] Team at [facility]  [facility] & Children's Center at [facility] ([address] [place], [place] [address]) Entrance C, located off of [address] Free 24/7 valet parking   Your induction is scheduled for 8/6. Please DO NOT show up at the time you see in MyChart. Someone from Labor & Delivery will call you on the date of your induction to let you know what time to come in. Please keep your phone on and with you at all times, you have 1 hour to respond to them to let them know you are on your way.  Go to the main desk at the [facility] Center and let them know you are there to be induced. They will send someone from Labor & Delivery to come get you.  You will get a call from a nurse from the hospital within the next day or so to go over some information. If you have any questions, please let us  know.    CLASSES: Go to [email or web address] to register for classes (childbirth, breastfeeding, waterbirth, infant CPR, daddy bootcamp, etc.)  Call the office [phone]) or go to [facility] if: You begin to have strong, frequent contractions Your water breaks.  Sometimes it is a big gush of fluid, sometimes it is just a trickle that keeps getting your panties wet or running down your legs You have vaginal bleeding.  It is normal to have a small amount of spotting if your cervix was checked.  You don't feel your baby moving like normal.  If you don't, get you something to eat and drink and lay down and focus on feeling your baby move.   If your baby is still not  moving like normal, you should call the office or go to [facility].  Call the office [phone]) or go to [facility] hospital for these signs of pre-eclampsia: Severe headache that does not go away with Tylenol  Visual changes- seeing spots, double, blurred vision Pain under your right breast or upper abdomen that does not go away with Tums or heartburn medicine Nausea and/or vomiting Severe swelling in your hands, feet, and face   Tdap Vaccine It is recommended that you get the Tdap vaccine during the third trimester of EACH pregnancy to help protect your baby from getting pertussis (whooping cough) 27-36 weeks is the BEST time to do this so that you can pass the protection on to your baby. During pregnancy is better than after pregnancy, but if you are unable to get it during pregnancy it will be offered at the hospital.  You can get this vaccine with us , at the health department, your family doctor, or some local pharmacies Everyone who will be around your baby should also be up-to-date on their vaccines before the baby comes. Adults (who are not pregnant) only need 1 dose of Tdap during adulthood.   [facility] Pediatricians/Family Doctors [facility]): [address] Luba BROCKS, [phone]           [facility]: [address], [phone]  [facility] Family Medicine [facility]): [address], [phone] (call to ask if accepting patients) [facility] Department: [address], [place], [phone]    [facility] Pediatricians/Family Doctors [facility]): [phone], [phone] [facility]: [address] [place], [phone] [facility]: [address], [phone]  [facility] Doctors  [facility]): [phone] [facility]: [address], [phone]   [facility]  Doctors [facility]: [address] [phone]  [facility] Doctors  [facility]: [phone], [phone]  Home Blood Pressure Monitoring for Patients   Your provider has recommended that you check your blood pressure (BP) at least once a week at home. If you do not have a blood pressure cuff at home, one will be provided for you. Contact your provider if you have not received your monitor within 1 week.   Helpful Tips for Accurate Home Blood Pressure Checks  Don't smoke, exercise, or drink caffeine 30 minutes before checking your BP Use the restroom before checking your BP (a full bladder can raise your pressure) Relax in a comfortable upright chair Feet on the ground Left arm resting comfortably on a flat surface at the level of your heart Legs uncrossed Back supported Sit quietly and don't talk Place the cuff on your bare arm Adjust snuggly, so that only two fingertips can fit between your skin and the top of the cuff Check 2 readings separated by at least one minute Keep a log of your BP readings For a visual, please reference this diagram: http://ccnc.care/bpdiagram  Provider Name: [facility]     Phone: [phone]  Zone 1: ALL CLEAR  Continue to monitor your symptoms:  BP reading is less than 140 (top number) or less than 90 (bottom number)  No right upper stomach pain No headaches or seeing spots No feeling nauseated or throwing up No swelling in face and hands  Zone 2: CAUTION Call your doctor's office for any of the following:  BP reading is greater than 140 (top number) or greater than 90 (bottom number)  Stomach pain under your ribs in the middle or right side Headaches or seeing spots Feeling nauseated or throwing up Swelling in face and hands  Zone 3: EMERGENCY  Seek immediate medical care if you have any of the following:  BP reading is greater than160 (top number) or greater than 110 (bottom number) Severe  headaches not improving with Tylenol  Serious difficulty catching your breath Any worsening symptoms from Zone 2  Preterm Labor and Birth Information  The normal length of a pregnancy is 39-41 weeks. Preterm labor is when labor starts before 37 completed weeks of pregnancy. What are the risk factors for preterm labor? Preterm labor is more likely to occur in women who: Have certain infections during pregnancy such as a bladder infection, sexually transmitted infection, or infection inside the uterus (chorioamnionitis). Have a shorter-than-normal cervix. Have gone into preterm labor before. Have had surgery on their cervix. Are younger than age 58 or older than age 48. Are African American. Are pregnant with twins or multiple babies (multiple gestation). Take street drugs or smoke while pregnant. Do not gain enough weight while pregnant. Became pregnant shortly after having been pregnant. What are the symptoms of preterm labor? Symptoms of preterm labor include: Cramps similar to those that can happen during a menstrual period. The cramps may happen with diarrhea.  Pain in the abdomen or lower back. Regular uterine contractions that may feel like tightening of the abdomen. A feeling of increased pressure in the pelvis. Increased watery or bloody mucus discharge from the vagina. Water breaking (ruptured amniotic sac). Why is it important to recognize signs of preterm labor? It is important to recognize signs of preterm labor because babies who are born prematurely may not be fully developed. This can put them at an increased risk for: Long-term (chronic) heart and lung problems. Difficulty immediately after birth with regulating body systems, including blood sugar, body temperature, heart rate, and breathing rate. Bleeding in the brain. Cerebral palsy. Learning difficulties. Death. These risks are highest for babies who are born before 34 weeks of pregnancy. How is preterm labor  treated? Treatment depends on the length of your pregnancy, your condition, and the health of your baby. It may involve: Having a stitch (suture) placed in your cervix to prevent your cervix from opening too early (cerclage). Taking or being given medicines, such as: Hormone medicines. These may be given early in pregnancy to help support the pregnancy. Medicine to stop contractions. Medicines to help mature the baby's lungs. These may be prescribed if the risk of delivery is high. Medicines to prevent your baby from developing cerebral palsy. If the labor happens before 34 weeks of pregnancy, you may need to stay in the hospital. What should I do if I think I am in preterm labor? If you think that you are going into preterm labor, call your health care provider right away. How can I prevent preterm labor in future pregnancies? To increase your chance of having a full-term pregnancy: Do not use any tobacco products, such as cigarettes, chewing tobacco, and e-cigarettes. If you need help quitting, ask your health care provider. Do not use street drugs or medicines that have not been prescribed to you during your pregnancy. Talk with your health care provider before taking any herbal supplements, even if you have been taking them regularly. Make sure you gain a healthy amount of weight during your pregnancy. Watch for infection. If you think that you might have an infection, get it checked right away. Make sure to tell your health care provider if you have gone into preterm labor before. This information is not intended to replace advice given to you by your health care provider. Make sure you discuss any questions you have with your health care provider. Document Revised: 08/06/2018 Document Reviewed: 09/05/2015 Elsevier Patient Education  2020 [employer].

## 2023-11-24 NOTE — Progress Notes (Signed)
 US  35+6 wks,cephalic,BPP 8/8,FHR 131 bpm,anterior placenta gr 3,AFI 18 cm,EFW 2442 g 18%,FL 1%,HC 7%,AC 40%,RI .69,.72,.67=92%

## 2023-11-24 NOTE — Addendum Note (Signed)
 Addended by: KIZZIE SUZEN SAUNDERS on: 11/24/2023 12:35 PM   Modules accepted: Orders

## 2023-11-24 NOTE — Progress Notes (Signed)
 HIGH-RISK PREGNANCY VISIT Patient name: Latasha [name] MRN [id number]  Date of birth: [birth year] Chief Complaint:   Routine Prenatal Visit and Pregnancy Ultrasound  History of Present Illness:   Latasha [name] is a 33 y.o. G11P1011 female at [redacted]w[redacted]d with an Estimated Date of Delivery: 12/23/23 being seen today for ongoing management of a high-risk pregnancy complicated by chronic hypertension currently on labetalol  200mg  BID and intermittently elevated UAD.    Today she reports no complaints. Contractions: Not present.  .  Movement: Present. denies leaking of fluid.      06/16/2023    9:40 AM 08/18/2022    2:18 PM 08/26/2021    9:32 AM 10/20/2019    8:41 AM 03/27/2017   10:10 AM  Depression screen PHQ 2/9  Decreased Interest 0 0 0 0 0  Down, Depressed, Hopeless 0 0 0 1 0  PHQ - 2 Score 0 0 0 1 0  Altered sleeping 0 1 0 1   Tired, decreased energy 0 0 0 2   Change in appetite 1 0 0 2   Feeling bad or failure about yourself  0 0 0 0   Trouble concentrating 0 0 0 0   Moving slowly or fidgety/restless 0 0 0 0   Suicidal thoughts 0 0 0 0   PHQ-9 Score 1 1 0 6   Difficult doing work/chores  Not difficult at all  Somewhat difficult         06/16/2023    9:40 AM 08/18/2022    2:18 PM 08/26/2021    9:32 AM 10/20/2019    8:42 AM  GAD 7 : Generalized Anxiety Score  Nervous, Anxious, on Edge 0 0 0 0  Control/stop worrying 0 0 0 0  Worry too much - different things 0 0 0 1  Trouble relaxing 0 0 0 1  Restless 0 0 0 0  Easily annoyed or irritable 0 0 0 1  Afraid - awful might happen 0 0 0 0  Total GAD 7 Score 0 0 0 3  Anxiety Difficulty  Not difficult at all  Not difficult at all     Review of Systems:   Pertinent items are noted in HPI Denies abnormal vaginal discharge w/ itching/odor/irritation, headaches, visual changes, shortness of breath, chest pain, abdominal pain, severe nausea/vomiting, or problems with urination or bowel movements unless otherwise stated above. Pertinent  History Reviewed:  Reviewed past medical,surgical, social, obstetrical and family history.  Reviewed problem list, medications and allergies. Physical Assessment:   Vitals:   11/24/23 1052  BP: 126/85  Pulse: 80  Weight: 219 lb (99.3 kg)  Body mass index is 35.35 kg/m.           Physical Examination:   General appearance: alert, well appearing, and in no distress  Mental status: alert, oriented to person, place, and time  Skin: warm & dry   Extremities: Edema: None    Cardiovascular: normal heart rate noted  Respiratory: normal respiratory effort, no distress  Abdomen: gravid, soft, non-tender  Pelvic: Cervical exam performed  Dilation: 1.5 Effacement (%): Thick Station: Ballotable  Fetal Status:     Movement: Present Presentation: Vertex  Fetal Surveillance Testing today: US  35+6 wks,cephalic,BPP 8/8,FHR 131 bpm,anterior placenta gr 3,AFI 18 cm,EFW 2442 g 18%,FL 1%,HC 7%,AC 40%,RI .69,.72,.67=92%   Chaperone: Alan Fischer  Results for orders placed or performed in visit on 11/24/23 (from the past 24 hours)  POC Urinalysis Dipstick OB   Collection Time: 11/24/23 11:20 AM  Result Value Ref Range   Color, UA     Clarity, UA     Glucose, UA Negative Negative   Bilirubin, UA     Ketones, UA negative    Spec Grav, UA     Blood, UA negative    pH, UA     POC,PROTEIN,UA Negative Negative, Trace, Small (1+), Moderate (2+), Large (3+), 4+   Urobilinogen, UA     Nitrite, UA negative    Leukocytes, UA Trace (A) Negative   Appearance     Odor      Assessment & Plan:  High-risk pregnancy: G3P1011 at [redacted]w[redacted]d with an Estimated Date of Delivery: 12/23/23   1) CHTN, stable on labetalol  200mg  BID, ASA, check daily if >140/90 or pre-e s/s, let us  know  2) Intermittently elevated UAD, today borderline 92%  Meds: No orders of the defined types were placed in this encounter.  Labs/procedures today: GBS, GC/CT, SVE, and U/S  Treatment Plan:  2x/wk testing, IOL 37wks, scheduled for  8/6 AM,  IOL form faxed and orders placed   Reviewed: Preterm labor symptoms and general obstetric precautions including but not limited to vaginal bleeding, contractions, leaking of fluid and fetal movement were reviewed in detail with the patient.  All questions were answered. Does have home bp cuff. Office bp cuff given: not applicable. Check bp daily, let us  know if consistently >140 and/or >90.  Follow-up: Return for on 8/1 & 8/5, cancel rest.   Future Appointments  Date Time Provider Department Center  11/27/2023  9:50 AM [facility] NURSE [facility]  12/01/2023  1:30 PM [facility] US  [facility] None  12/01/2023  2:30 PM Delores Nidia CROME, FNP [facility]  12/02/2023  7:00 AM [facility] SCHED ROOM [facility] None    Orders Placed This Encounter  Procedures   Culture, beta strep (group b only)   POC Urinalysis Dipstick OB   Suzen JONELLE Fetters [place], [facility] 11/24/2023 12:31 PM

## 2023-11-25 LAB — CERVICOVAGINAL ANCILLARY ONLY
Chlamydia: NEGATIVE
Comment: NEGATIVE
Comment: NORMAL
Neisseria Gonorrhea: NEGATIVE

## 2023-11-26 ENCOUNTER — Telehealth (HOSPITAL_COMMUNITY): Payer: Self-pay | Admitting: *Deleted

## 2023-11-26 ENCOUNTER — Encounter (HOSPITAL_COMMUNITY): Payer: Self-pay | Admitting: *Deleted

## 2023-11-26 NOTE — Telephone Encounter (Signed)
 Preadmission screen

## 2023-11-27 ENCOUNTER — Ambulatory Visit (INDEPENDENT_AMBULATORY_CARE_PROVIDER_SITE_OTHER): Admitting: *Deleted

## 2023-11-27 VITALS — BP 122/81 | HR 68 | Wt 220.0 lb

## 2023-11-27 DIAGNOSIS — I10 Essential (primary) hypertension: Secondary | ICD-10-CM | POA: Diagnosis not present

## 2023-11-27 DIAGNOSIS — Z3A36 36 weeks gestation of pregnancy: Secondary | ICD-10-CM

## 2023-11-27 DIAGNOSIS — O0993 Supervision of high risk pregnancy, unspecified, third trimester: Secondary | ICD-10-CM

## 2023-11-27 NOTE — Progress Notes (Signed)
   NURSE VISIT- NST  SUBJECTIVE:  Latasha [name] is a 33 y.o. G20P1011 female at [redacted]w[redacted]d, here for a NST for pregnancy complicated by [facility] and Elevated dopplers.  She reports active fetal movement, contractions: none, vaginal bleeding: none, membranes: intact.   OBJECTIVE:  BP 122/81   Pulse 68   Wt 220 lb (99.8 kg)   LMP  (LMP Unknown)   BMI 35.51 kg/m   Appears well, no apparent distress  No results found for this or any previous visit (from the past 24 hours).  NST: FHR baseline 125 bpm, Variability: moderate, Accelerations:present, Decelerations:  Absent= Cat 1/reactive Toco: none   ASSESSMENT: G3P1011 at [redacted]w[redacted]d with CHTN and Elevated dopplers NST reactive  PLAN: EFM strip reviewed by Dr. Ozan   Recommendations: keep next appointment as scheduled    Rutherford Rover  11/27/2023 10:40 AM

## 2023-11-28 LAB — CULTURE, BETA STREP (GROUP B ONLY): Strep Gp B Culture: NEGATIVE

## 2023-11-30 ENCOUNTER — Ambulatory Visit: Payer: Self-pay | Admitting: Adult Health

## 2023-11-30 DIAGNOSIS — O0993 Supervision of high risk pregnancy, unspecified, third trimester: Secondary | ICD-10-CM

## 2023-12-01 ENCOUNTER — Ambulatory Visit: Admitting: Obstetrics and Gynecology

## 2023-12-01 ENCOUNTER — Ambulatory Visit (INDEPENDENT_AMBULATORY_CARE_PROVIDER_SITE_OTHER)

## 2023-12-01 VITALS — BP 116/73 | HR 74 | Wt 221.5 lb

## 2023-12-01 DIAGNOSIS — O10919 Unspecified pre-existing hypertension complicating pregnancy, unspecified trimester: Secondary | ICD-10-CM

## 2023-12-01 DIAGNOSIS — Z3A36 36 weeks gestation of pregnancy: Secondary | ICD-10-CM

## 2023-12-01 DIAGNOSIS — O10913 Unspecified pre-existing hypertension complicating pregnancy, third trimester: Secondary | ICD-10-CM | POA: Diagnosis not present

## 2023-12-01 DIAGNOSIS — O0993 Supervision of high risk pregnancy, unspecified, third trimester: Secondary | ICD-10-CM

## 2023-12-01 NOTE — Progress Notes (Signed)
 US  36+6 wks,cephalic,BPP 8/8,FHR 163 bpm,anterior placenta gr 3,AFI 16 cm,RI .65,.66,.63=85%

## 2023-12-01 NOTE — Progress Notes (Signed)
   PRENATAL VISIT NOTE  Subjective:  Latasha [name] is a 33 y.o. G3P1011 at [redacted]w[redacted]d being seen today for ongoing prenatal care.  She is currently monitored for the following issues for this high-risk pregnancy and has Rubella non-immune status, antepartum; Asthma; Dysplasia of cervix; History of diabetes mellitus; History of pancreatitis; Alpha thalassemia silent carrier; Essential hypertension; and Supervision of high-risk pregnancy on their problem list.  Patient reports no complaints.  Contractions: Not present.  .  Movement: Present. Denies leaking of fluid.   The following portions of the patient's history were reviewed and updated as appropriate: allergies, current medications, past family history, past medical history, past social history, past surgical history and problem list.   Objective:    Vitals:   12/01/23 1420  BP: 116/73  Pulse: 74  Weight: 221 lb 8 oz (100.5 kg)    Fetal Status:      Movement: Present Presentation: Vertex  General: Alert, oriented and cooperative. Patient is in no acute distress.  Skin: Skin is warm and dry. No rash noted.   Cardiovascular: Normal heart rate noted  Respiratory: Normal respiratory effort, no problems with respiration noted  Abdomen: Soft, gravid, appropriate for gestational age.  Pain/Pressure: Present     Pelvic: Cervical exam performed in the presence of a chaperone Dilation: 1 Effacement (%): 50 Station: Ballotable  Extremities: Normal range of motion.     Mental Status: Normal mood and affect. Normal behavior. Normal judgment and thought content.   Assessment and Plan:  Pregnancy: G3P1011 at [redacted]w[redacted]d 1. Supervision of high risk pregnancy in third trimester (Primary) BP and FHR normal Doing well, feeling regular movement   2. [redacted] weeks gestation of pregnancy Swabs collected last visit   3. Chronic hypertension affecting pregnancy Normotensive  IOl scheduled tomorrow, instructed to wait for call BPP 8/8, cephalic  Preterm  labor symptoms and general obstetric precautions including but not limited to vaginal bleeding, contractions, leaking of fluid and fetal movement were reviewed in detail with the patient. Please refer to After Visit Summary for other counseling recommendations.    Future Appointments  Date Time Provider Department Center  12/02/2023  7:00 AM [facility] SCHED ROOM [facility] None    Nidia Daring, FNP

## 2023-12-02 ENCOUNTER — Inpatient Hospital Stay (HOSPITAL_COMMUNITY)

## 2023-12-02 NOTE — H&P (Addendum)
 OBSTETRIC ADMISSION HISTORY AND PHYSICAL  Latasha [name] is a 33 y.o. female G3P1011 with IUP at [redacted]w[redacted]d by 8 week ultrasound presenting for IOL for cHTN on labetalol  with intermittently elevated UAD. She reports +FMs, No LOF, no VB, no blurry vision, headaches or peripheral edema, and RUQ pain.  She plans on breast and bottle feeding. She requests BTL but does not have consent > 30 days for birth control. She received [facility]   Dating: By 8 week US  --->  Estimated Date of Delivery: 12/23/23  Sono:    @[redacted]w[redacted]d , CWD, normal anatomy, cephalic presentation, anterior placenta, 2442 g, 18% EFW, HC 7%, AC 40%. Dopplers borderline at 92%   Prenatal History/Complications:  Rubella non-immune Asthma (albuterol  last used > 1 month ago) Dysplasia of cervix Prediabetes Alpha thalassemia silent carrier Chronic HTN on labetalol  with intermittently elevated UAD  Past Medical History: Past Medical History:  Diagnosis Date   Asthma    Diabetes mellitus without complication (HCC)    History of pancreatitis    Hypertension    Vaginal Pap smear, abnormal     Past Surgical History: Past Surgical History:  Procedure Laterality Date   CERVICAL POLYPECTOMY     GALLBLADDER SURGERY  07/2019    Obstetrical History: OB History     Gravida  3   Para  1   Term  1   Preterm      AB  1   Living  1      SAB  1   IAB      Ectopic      Multiple  0   Live Births  1           Social History Social History   Socioeconomic History   Marital status: Married    Spouse name: Not on file   Number of children: Not on file   Years of education: Not on file   Highest education level: Not on file  Occupational History   Not on file  Tobacco Use   Smoking status: Former    Types: Cigars   Smokeless tobacco: Never  Vaping Use   Vaping status: Never Used  Substance and Sexual Activity   Alcohol use: No    Alcohol/week: 0.0 standard drinks of alcohol    Drug use: No    Types: Marijuana    Comment: not now   Sexual activity: Yes    Birth control/protection: None  Other Topics Concern   Not on file  Social History Narrative   Not on file   Social Drivers of Health   Financial Resource Strain: Low Risk  (06/16/2023)   Overall Financial Resource Strain (CARDIA)    Difficulty of Paying Living Expenses: Not very hard  Food Insecurity: No Food Insecurity (06/16/2023)   Hunger Vital Sign    Worried About Running Out of Food in the Last Year: Never true    Ran Out of Food in the Last Year: Never true  Transportation Needs: No Transportation Needs (06/16/2023)   PRAPARE - Administrator, Civil Service (Medical): No    Lack of Transportation (Non-Medical): No  Physical Activity: Sufficiently Active (06/16/2023)   Exercise Vital Sign    Days of Exercise per Week: 5 days    Minutes of Exercise per Session: 60 min  Stress: No Stress Concern Present (06/16/2023)   [employer] of Occupational Health - Occupational Stress Questionnaire    Feeling of Stress : Not at  all  Social Connections: Socially Integrated (06/16/2023)   Social Connection and Isolation Panel    Frequency of Communication with Friends and Family: More than three times a week    Frequency of Social Gatherings with Friends and Family: Three times a week    Attends Religious Services: More than 4 times per year    Active Member of Clubs or Organizations: Yes    Attends Engineer, structural: More than 4 times per year    Marital Status: Married    Family History: Family History  Problem Relation Age of Onset   Diabetes Paternal Grandfather    Diabetes Paternal Grandmother    Hypertension Maternal Grandmother    Cancer Maternal Grandfather    Kidney disease Father    Cancer Mother        breast, cervical   Stroke Mother    Hypertension Mother    Anuerysm Brother    Stroke Maternal Aunt    Hypertension Paternal Aunt    Diabetes Paternal Uncle     Hypertension Paternal Uncle    Kidney disease Paternal Uncle    Hypertension Paternal Uncle     Allergies: No Known Allergies  No medications prior to admission.     Review of Systems   All systems reviewed and negative except as stated in HPI  not currently breastfeeding. General appearance: alert, cooperative, and no distress Lungs: clear to auscultation bilaterally Heart: regular rate and rhythm Abdomen: soft, non-tender; bowel sounds normal Pelvic: Normal external genitalia. No lesions Extremities: Homans sign is negative, no sign of DVT Presentation: cephalic Fetal monitoring: Baseline: 130 bpm, Variability: Good {> 6 bpm), Accelerations: Reactive, and Decelerations: Absent Uterine activityNone     Prenatal labs: ABO, Rh: O/Positive/-- (02/18 1349) Antibody: Negative (06/06 0910) Rubella: <0.90 (02/18 1349) RPR: Non Reactive (06/06 0910)  HBsAg: Negative (02/18 1349)  HIV: Non Reactive (06/06 0910)  GBS: Negative/-- (07/29 1330)    Lab Results  Component Value Date   GBS Negative 11/24/2023   GTT: Normal Genetic screening: NIPS low risk, Horizon alpha-thalassemia silent carrier Anatomy US : Normal fetal anatomy, anterior placenta with no evidence of previa  Immunization History  Administered Date(s) Administered   Influenza,inj,Quad PF,6+ Mos 02/22/2013   Tdap 12/27/2021, 10/27/2023    Prenatal Transfer Tool  Maternal Diabetes: No Genetic Screening: Abnormal:  Results: Other: Silent carrier alpha-thalassemia  Maternal Ultrasounds/Referrals: Normal Fetal Ultrasounds or other Referrals:  None Maternal Substance Abuse:  No Significant Maternal Medications:  Meds include: Other: Albuterol  PRN and labetalol  Significant Maternal Lab Results: Group B Strep negative and Other: Rubella non-immune Number of Prenatal Visits:Less than or equal to 3 verified prenatal visits Maternal Vaccinations:TDap Other Comments:  None   No results found for this or any  previous visit (from the past 24 hours).  Patient Active Problem List   Diagnosis Date Noted   Supervision of high-risk pregnancy 06/15/2023   Essential hypertension 08/19/2022   Alpha thalassemia silent carrier 09/10/2021   History of pancreatitis 08/26/2021   Dysplasia of cervix 04/12/2013   History of diabetes mellitus 04/12/2013   Asthma 03/07/2013   Rubella non-immune status, antepartum 02/23/2013    Assessment/Plan:  ADDALEIGH NICHOLLS is a 33 y.o. G3P1011 at [redacted]w[redacted]d here for IOL for cHTN on labetalol  with intermittently elevated dopplers  #Labor: IOL for cHTN wsith intermittently elevated dopplers #Pain: Per patient request #FWB: Category I #GBS status:  negative #Feeding: Breastmilk  and Formula #Reproductive Life planning: Nexplanon  #Circ:  yes  # cHTN with  intermittently elevated dopplers Currently on labetalol  200 mg BID.  Asymptomatic  BP on admission: 129/69    Vernell DELENA School, MD  12/02/2023, 10:04 PM  CNM attestation:  I have seen and examined this patient; I agree with above documentation in the resident's note.   TOYNA ERISMAN is a 33 y.o. G3P1011 here for IOL due to cHTN (Lab 200mg  bid), also with intermittent ^ dopplers with nl growth  PE: BP 129/69   Pulse 77   Temp 97.8 F (36.6 C) (Oral)   Resp 16   Ht 5' 6 (1.676 m)   Wt 99.2 kg   LMP  (LMP Unknown)   BMI 35.28 kg/m  Gen: calm comfortable, NAD Resp: normal effort, no distress Abd: gravid  ROS, labs, PMH reviewed  Plan: Admit to L&D Began cx ripening w foley and vag cytotec  placement; plan for AROM/Pit when foley comes out Continue home Labetalol  Anticipate vag delivery  Suzen JONETTA Gentry CNM 12/03/2023, 7:55 AM

## 2023-12-03 ENCOUNTER — Encounter (HOSPITAL_COMMUNITY): Payer: Self-pay | Admitting: Obstetrics & Gynecology

## 2023-12-03 ENCOUNTER — Other Ambulatory Visit: Payer: Self-pay

## 2023-12-03 ENCOUNTER — Inpatient Hospital Stay (HOSPITAL_COMMUNITY)
Admission: RE | Admit: 2023-12-03 | Discharge: 2023-12-05 | DRG: 807 | Disposition: A | Discharge status: Home / Self Care | Attending: Obstetrics and Gynecology | Admitting: Obstetrics and Gynecology

## 2023-12-03 DIAGNOSIS — Z3A37 37 weeks gestation of pregnancy: Secondary | ICD-10-CM

## 2023-12-03 DIAGNOSIS — O1092 Unspecified pre-existing hypertension complicating childbirth: Secondary | ICD-10-CM | POA: Diagnosis present

## 2023-12-03 DIAGNOSIS — Z87410 Personal history of cervical dysplasia: Secondary | ICD-10-CM | POA: Diagnosis not present

## 2023-12-03 DIAGNOSIS — Z8249 Family history of ischemic heart disease and other diseases of the circulatory system: Secondary | ICD-10-CM

## 2023-12-03 DIAGNOSIS — Z87891 Personal history of nicotine dependence: Secondary | ICD-10-CM | POA: Diagnosis not present

## 2023-12-03 DIAGNOSIS — O1002 Pre-existing essential hypertension complicating childbirth: Secondary | ICD-10-CM | POA: Diagnosis not present

## 2023-12-03 DIAGNOSIS — Z833 Family history of diabetes mellitus: Secondary | ICD-10-CM | POA: Diagnosis not present

## 2023-12-03 DIAGNOSIS — I10 Essential (primary) hypertension: Principal | ICD-10-CM | POA: Diagnosis present

## 2023-12-03 DIAGNOSIS — O09899 Supervision of other high risk pregnancies, unspecified trimester: Secondary | ICD-10-CM

## 2023-12-03 DIAGNOSIS — O9952 Diseases of the respiratory system complicating childbirth: Secondary | ICD-10-CM | POA: Diagnosis present

## 2023-12-03 DIAGNOSIS — Z148 Genetic carrier of other disease: Secondary | ICD-10-CM

## 2023-12-03 DIAGNOSIS — Z2839 Other underimmunization status: Secondary | ICD-10-CM

## 2023-12-03 DIAGNOSIS — J45909 Unspecified asthma, uncomplicated: Secondary | ICD-10-CM | POA: Diagnosis present

## 2023-12-03 DIAGNOSIS — O10919 Unspecified pre-existing hypertension complicating pregnancy, unspecified trimester: Secondary | ICD-10-CM

## 2023-12-03 DIAGNOSIS — O0993 Supervision of high risk pregnancy, unspecified, third trimester: Secondary | ICD-10-CM

## 2023-12-03 LAB — CBC WITH DIFFERENTIAL/PLATELET
Abs Immature Granulocytes: 0.09 K/uL — ABNORMAL HIGH (ref 0.00–0.07)
Basophils Absolute: 0 K/uL (ref 0.0–0.1)
Basophils Relative: 0 %
Eosinophils Absolute: 0 K/uL (ref 0.0–0.5)
Eosinophils Relative: 0 %
HCT: 29.2 % — ABNORMAL LOW (ref 36.0–46.0)
Hemoglobin: 9.8 g/dL — ABNORMAL LOW (ref 12.0–15.0)
Immature Granulocytes: 1 %
Lymphocytes Relative: 12 %
Lymphs Abs: 1.9 K/uL (ref 0.7–4.0)
MCH: 28.5 pg (ref 26.0–34.0)
MCHC: 33.6 g/dL (ref 30.0–36.0)
MCV: 84.9 fL (ref 80.0–100.0)
Monocytes Absolute: 0.8 K/uL (ref 0.1–1.0)
Monocytes Relative: 5 %
Neutro Abs: 12.6 K/uL — ABNORMAL HIGH (ref 1.7–7.7)
Neutrophils Relative %: 82 %
Platelets: 306 K/uL (ref 150–400)
RBC: 3.44 MIL/uL — ABNORMAL LOW (ref 3.87–5.11)
RDW: 14.5 % (ref 11.5–15.5)
WBC: 15.4 K/uL — ABNORMAL HIGH (ref 4.0–10.5)
nRBC: 0 % (ref 0.0–0.2)

## 2023-12-03 LAB — CBC
HCT: 28.6 % — ABNORMAL LOW (ref 36.0–46.0)
Hemoglobin: 9.4 g/dL — ABNORMAL LOW (ref 12.0–15.0)
MCH: 28.1 pg (ref 26.0–34.0)
MCHC: 32.9 g/dL (ref 30.0–36.0)
MCV: 85.6 fL (ref 80.0–100.0)
Platelets: 336 K/uL (ref 150–400)
RBC: 3.34 MIL/uL — ABNORMAL LOW (ref 3.87–5.11)
RDW: 14.9 % (ref 11.5–15.5)
WBC: 8.1 K/uL (ref 4.0–10.5)
nRBC: 0 % (ref 0.0–0.2)

## 2023-12-03 LAB — TYPE AND SCREEN
ABO/RH(D): O POS
Antibody Screen: NEGATIVE

## 2023-12-03 LAB — RPR: RPR Ser Ql: NONREACTIVE

## 2023-12-03 MED ORDER — ONDANSETRON HCL 4 MG/2ML IJ SOLN: 4.0000 mg | Freq: Four times a day (QID) | INTRAMUSCULAR | Status: DC | PRN

## 2023-12-03 MED ORDER — MISOPROSTOL 25 MCG QUARTER TABLET
25.0000 ug | ORAL_TABLET | Freq: Once | ORAL | Status: AC
  Administered 2023-12-03: 25 ug via VAGINAL
  Filled 2023-12-03: qty 1

## 2023-12-03 MED ORDER — MISOPROSTOL 25 MCG QUARTER TABLET
25.0000 ug | ORAL_TABLET | Freq: Once | ORAL | Status: AC
Start: 2023-12-03 — End: 2023-12-03
  Administered 2023-12-03: 25 ug via VAGINAL
  Filled 2023-12-03: qty 1

## 2023-12-03 MED ORDER — FLEET ENEMA RE ENEM: 1.0000 | ENEMA | RECTAL | Status: DC | PRN

## 2023-12-03 MED ORDER — ACETAMINOPHEN 325 MG PO TABS: 650.0000 mg | ORAL_TABLET | ORAL | Status: DC | PRN

## 2023-12-03 MED ORDER — MISOPROSTOL 50MCG HALF TABLET: 50.0000 ug | ORAL_TABLET | Freq: Once | ORAL | Status: DC

## 2023-12-03 MED ORDER — OXYTOCIN BOLUS FROM INFUSION: 333.0000 mL | Freq: Once | INTRAVENOUS | Status: DC

## 2023-12-03 MED ORDER — EPHEDRINE 5 MG/ML INJ: 10.0000 mg | INTRAVENOUS | Status: DC | PRN

## 2023-12-03 MED ORDER — SOD CITRATE-CITRIC ACID 500-334 MG/5ML PO SOLN: 30.0000 mL | ORAL | Status: DC | PRN

## 2023-12-03 MED ORDER — LABETALOL HCL 200 MG PO TABS
200.0000 mg | ORAL_TABLET | Freq: Two times a day (BID) | ORAL | Status: DC
Start: 2023-12-03 — End: 2023-12-04
  Administered 2023-12-04: 200 mg via ORAL
  Filled 2023-12-03: qty 1

## 2023-12-03 MED ORDER — LIDOCAINE HCL (PF) 1 % IJ SOLN: 30.0000 mL | INTRAMUSCULAR | Status: DC | PRN

## 2023-12-03 MED ORDER — FENTANYL CITRATE (PF) 100 MCG/2ML IJ SOLN
100.0000 ug | INTRAMUSCULAR | Status: DC | PRN
  Administered 2023-12-03 (×3): 100 ug via INTRAVENOUS
  Filled 2023-12-03 (×3): qty 2

## 2023-12-03 MED ORDER — FENTANYL-BUPIVACAINE-NACL 0.5-0.125-0.9 MG/250ML-% EP SOLN
12.0000 mL/h | EPIDURAL | Status: DC | PRN
  Filled 2023-12-03: qty 250

## 2023-12-03 MED ORDER — OXYTOCIN-SODIUM CHLORIDE 30-0.9 UT/500ML-% IV SOLN
1.0000 m[IU]/min | INTRAVENOUS | Status: DC
  Administered 2023-12-03: 2 m[IU]/min via INTRAVENOUS

## 2023-12-03 MED ORDER — DIPHENHYDRAMINE HCL 50 MG/ML IJ SOLN: 12.5000 mg | INTRAMUSCULAR | Status: DC | PRN

## 2023-12-03 MED ORDER — TERBUTALINE SULFATE 1 MG/ML IJ SOLN: 0.2500 mg | Freq: Once | INTRAMUSCULAR | Status: DC | PRN

## 2023-12-03 MED ORDER — OXYTOCIN-SODIUM CHLORIDE 30-0.9 UT/500ML-% IV SOLN
2.5000 [IU]/h | INTRAVENOUS | Status: DC
  Filled 2023-12-03: qty 500

## 2023-12-03 MED ORDER — PHENYLEPHRINE 80 MCG/ML (10ML) SYRINGE FOR IV PUSH (FOR BLOOD PRESSURE SUPPORT): 80.0000 ug | PREFILLED_SYRINGE | INTRAVENOUS | Status: DC | PRN

## 2023-12-03 MED ORDER — OXYTOCIN 10 UNIT/ML IJ SOLN
INTRAMUSCULAR | Status: AC
Start: 2023-12-03 — End: 2023-12-03
  Administered 2023-12-03: 10 [IU]
  Filled 2023-12-03: qty 1

## 2023-12-03 MED ORDER — OXYTOCIN 10 UNIT/ML IJ SOLN: 10.0000 [IU] | Freq: Once | INTRAMUSCULAR | Status: DC

## 2023-12-03 MED ORDER — HYDROXYZINE HCL 50 MG PO TABS: 50.0000 mg | ORAL_TABLET | Freq: Four times a day (QID) | ORAL | Status: DC | PRN

## 2023-12-03 MED ORDER — LACTATED RINGERS IV SOLN: INTRAVENOUS | Status: DC

## 2023-12-03 MED ORDER — OXYCODONE-ACETAMINOPHEN 5-325 MG PO TABS: 1.0000 | ORAL_TABLET | ORAL | Status: DC | PRN

## 2023-12-03 MED ORDER — LACTATED RINGERS IV SOLN: 500.0000 mL | Freq: Once | INTRAVENOUS | Status: DC

## 2023-12-03 MED ORDER — OXYCODONE-ACETAMINOPHEN 5-325 MG PO TABS: 2.0000 | ORAL_TABLET | ORAL | Status: DC | PRN

## 2023-12-03 MED ORDER — LACTATED RINGERS IV SOLN: 500.0000 mL | INTRAVENOUS | Status: DC | PRN

## 2023-12-03 NOTE — Progress Notes (Signed)
 Latasha [name] is a 33 y.o. G3P1011 at [redacted]w[redacted]d.  Subjective: Mild cramping  Objective: BP 139/77   Pulse 90   Temp 98.2 F (36.8 C) (Oral)   Resp 16   Ht 5' 6 (1.676 m)   Wt 99.2 kg   LMP  (LMP Unknown)   BMI 35.28 kg/m    FHT:  FHR: 135 bpm, variability: mod,  accelerations:  15x15,  decelerations:  none UC:  Irreg Dilation: 2 Presentation: Vertex Exam by:: RN  Labs: Results for orders placed or performed during the hospital encounter of 12/03/23 (from the past 24 hours)  CBC     Status: Abnormal   Collection Time: 12/03/23  6:20 AM  Result Value Ref Range   WBC 8.1 4.0 - 10.5 K/uL   RBC 3.34 (L) 3.87 - 5.11 MIL/uL   Hemoglobin 9.4 (L) 12.0 - 15.0 g/dL   HCT 71.3 (L) 63.9 - 53.9 %   MCV 85.6 80.0 - 100.0 fL   MCH 28.1 26.0 - 34.0 pg   MCHC 32.9 30.0 - 36.0 g/dL   RDW 85.0 88.4 - 84.4 %   Platelets 336 150 - 400 K/uL   nRBC 0.0 0.0 - 0.2 %  RPR     Status: None   Collection Time: 12/03/23  6:20 AM  Result Value Ref Range   RPR Ser Ql NON REACTIVE NON REACTIVE  Type and screen     Status: None   Collection Time: 12/03/23  6:37 AM  Result Value Ref Range   ABO/RH(D) O POS    Antibody Screen NEG    Sample Expiration      12/06/2023,2359 Performed at [facility], [address], [place], [place] [address]     Assessment / Plan: [redacted]w[redacted]d week IUP ROM x rupture date or rupture time have not been documented Labor: Early/IOL. Continue vaginal Cytotec  Fetal Wellbeing:  Category I Pain Control:  Comfort measures Anticipated MOD:  SVD  Pheonix Clinkscale , CNM 12/03/2023 12:30 PM

## 2023-12-03 NOTE — Progress Notes (Signed)
 Latasha [name] is a 33 y.o. G3P1011 at [redacted]w[redacted]d.  Subjective: Coping well with comfort measures.   Objective: BP 139/77   Pulse 90   Temp 98.2 F (36.8 C) (Oral)   Resp 16   Ht 5' 6 (1.676 m)   Wt 99.2 kg   LMP  (LMP Unknown)   BMI 35.28 kg/m    FHT:  FHR: 135 bpm, variability: mod,  accelerations:  15x15,  decelerations:  none UC:   Irreg,  Dilation: 5 Effacement (%): 30 Station: Ballotable Presentation: Vertex (verified via BS US ) Exam by:: Latasha [name], CNM  Labs: NA  Assessment / Plan: [redacted]w[redacted]d week IUP ROM x rupture date or rupture time have not been documented Labor: Early, progressed well after Cytotec  and foley. Start pitocin .  Fetal Wellbeing:  Category I Pain Control:  Comfort measures.  Anticipated MOD:  SVD CHTN: BP stable. No signs of Pre-E  [name], Latasha [name] , CNM 12/03/2023 5:00 PM

## 2023-12-03 NOTE — Discharge Summary (Signed)
 Postpartum Discharge Summary     Patient Name: Latasha [name] DOB: [birth year] MRN: [id number]  Date of admission: 12/03/2023 Delivery date:12/03/2023 Delivering provider: JANITA MILLMAN A Date of discharge: 12/05/2023  Admitting diagnosis: Chronic hypertension [I10] Intrauterine pregnancy: [redacted]w[redacted]d     Secondary diagnosis:  Principal Problem:   Chronic hypertension Active Problems:   Rubella non-immune status, antepartum   Vaginal delivery  Additional problems: N/A    Discharge diagnosis: Term Pregnancy Delivered and CHTN                                              Post partum procedures:None Augmentation: AROM, Pitocin , Cytotec , and IP Foley Complications: None  Hospital course: Induction of Labor With Vaginal Delivery   33 y.o. yo [id number] at [redacted]w[redacted]d was admitted to the hospital 12/03/2023 for induction of labor.  Indication for induction: CHTN.  Patient had an labor course complicated by nothing Membrane Rupture Time/Date: 8:49 PM,12/03/2023  Delivery Method:Vaginal, Spontaneous Operative Delivery:N/A Episiotomy: None Lacerations:  Periurethral Details of delivery can be found in separate delivery note.  Patient had an uncomplicated postpartum course. Patient is discharged home 12/05/23.  Newborn Data: Birth date:12/03/2023 Birth time:11:17 PM Gender:Female Living status:Living Apgars:8 ,9  Weight:2807 g  Magnesium Sulfate received: No BMZ received: No Rhophylac:N/A MMR:N/A Due at discharge, patient is amenable.  T-DaP:Given prenatally Flu: N/A RSV Vaccine received: No Transfusion:No  Immunizations received: Immunization History  Administered Date(s) Administered   Influenza,inj,Quad PF,6+ Mos 02/22/2013   Tdap 12/27/2021, 10/27/2023    Physical exam  Vitals:   12/04/23 1000 12/04/23 1400 12/04/23 2036 12/05/23 0933  BP: 134/74 129/77 121/76 138/81  Pulse: 79 76 70 75  Resp: 17 16 16 18   Temp: 98 F (36.7 C) 98 F (36.7 C) 98.2 F (36.8 C) 97.9 F (36.6 C)   TempSrc: Oral Oral Oral Oral  SpO2: 99% 99%    Weight:      Height:       General: alert, cooperative, and no distress Lochia: appropriate Uterine Fundus: firm Incision: N/A DVT Evaluation: No evidence of DVT seen on physical exam. Labs: Lab Results  Component Value Date   WBC 19.7 (H) 12/04/2023   HGB 8.8 (L) 12/04/2023   HCT 26.0 (L) 12/04/2023   MCV 84.7 12/04/2023   PLT 305 12/04/2023      Latest Ref Rng & Units 06/16/2023    1:49 PM  CMP  Glucose 70 - 99 mg/dL 87   BUN 6 - 20 mg/dL 4   Creatinine 9.42 - 8.99 mg/dL 9.40   Sodium 865 - 855 mmol/L 135   Potassium 3.5 - 5.2 mmol/L 4.2   Chloride 96 - 106 mmol/L 101   CO2 20 - 29 mmol/L 21   Calcium 8.7 - 10.2 mg/dL 9.3   Total Protein 6.0 - 8.5 g/dL 6.9   Total Bilirubin 0.0 - 1.2 mg/dL <9.7   Alkaline Phos 44 - 121 IU/L 74   AST 0 - 40 IU/L 9   ALT 0 - 32 IU/L 7    Edinburgh Score:    12/04/2023    2:01 AM  Edinburgh Postnatal Depression Scale Screening Tool  I have been able to laugh and see the funny side of things. 0  I have looked forward with enjoyment to things. 0  I have blamed myself unnecessarily when things went wrong. 1  I have been anxious or worried for no good reason. 1  I have felt scared or panicky for no good reason. 1  Things have been getting on top of me. 1  I have been so unhappy that I have had difficulty sleeping. 1  I have felt sad or miserable. 1  I have been so unhappy that I have been crying. 0  The thought of harming myself has occurred to me. 0  Edinburgh Postnatal Depression Scale Total 6   Edinburgh Postnatal Depression Scale Total: 6   After visit meds:  Allergies as of 12/05/2023   No Known Allergies      Medication List     STOP taking these medications    Blood Pressure Monitor Misc   ferrous gluconate  324 MG tablet Commonly known as: FERGON   labetalol  200 MG tablet Commonly known as: NORMODYNE    promethazine  25 MG tablet Commonly known as: PHENERGAN     silver  sulfADIAZINE  1 % cream Commonly known as: Silvadene        TAKE these medications    acetaminophen  500 MG tablet Commonly known as: TYLENOL  Take 1 tablet (500 mg total) by mouth every 4 (four) hours as needed (for pain scale < 4).   albuterol  108 (90 Base) MCG/ACT inhaler Commonly known as: VENTOLIN  HFA Inhale 2 puffs into the lungs every 6 (six) hours as needed for wheezing or shortness of breath.   [employer] PRENATAL GUMMY PO Take by mouth.   furosemide  20 MG tablet Commonly known as: LASIX  Take 1 tablet (20 mg total) by mouth daily for 3 days. Start taking on: December 06, 2023   ibuprofen  200 MG tablet Commonly known as: ADVIL  Take 3 tablets (600 mg total) by mouth every 6 (six) hours.   measles, mumps & rubella vaccine injection Commonly known as: MMR Inject 0.5 mLs into the skin once for 1 dose.   NIFEdipine  30 MG 24 hr tablet Commonly known as: ADALAT  CC Take 1 tablet (30 mg total) by mouth daily. Start taking on: December 06, 2023   potassium chloride  SA 20 MEQ tablet Commonly known as: KLOR-CON  M Take 1 tablet (20 mEq total) by mouth daily for 3 days. Start taking on: December 06, 2023         Discharge home in stable condition Infant Feeding: Bottle and Breast Infant Disposition:home with mother Discharge instruction: per After Visit Summary and Postpartum booklet. Activity: Advance as tolerated. Pelvic rest for 6 weeks.  Diet: routine diet Future Appointments:No future appointments. Follow up Visit:   Please schedule this patient for a In person postpartum visit in 4 weeks with the following provider: MD. (For BTL preop, papers already signed) Additional Postpartum F/U:BP check 1 week  Low risk pregnancy complicated by: HTN Delivery mode:  Vaginal, Spontaneous Anticipated Birth Control:  Plans Interval BTL   Barkley Angles, MD OB Fellow, Faculty Practice [facility], [facility] for [employer]

## 2023-12-03 NOTE — Progress Notes (Signed)
 Labor Progress Note Latasha [name] is a 33 y.o. G3P1011 at [redacted]w[redacted]d presented for IOL for cHTN on labetalol   S: Latasha [name] is laying in bed. Having more pain with contractions now. She is amenable to check and AROM. Risks and benefits of AROM were discussed.   O:  BP 139/77   Pulse 90   Temp 98.2 F (36.8 C) (Oral)   Resp 16   Ht 5' 6 (1.676 m)   Wt 99.2 kg   LMP  (LMP Unknown)   BMI 35.28 kg/m  EFM: 130 baseline/moderate variability/accelerations present, no decelerations  CVE: Dilation: 5.5 Effacement (%): 50 Station: -3 Presentation: Vertex Exam by:: Jyren Cerasoli MD   A&P: 33 y.o. G3P1011 [redacted]w[redacted]d here for IOL for cHTN on labetalol  #Labor: Progressing well. AROM at the time of this check  #Pain: Non-pharamacologic comfort measures.  #FWB: Category I FHT #GBS negative  # cHTN with intermittently elevated dopplers Currently on labetalol  200 mg BID.  Asymptomatic  Largely normotensive with an isolated mild-range BP at 8/7 1401  Latasha DELENA School, MD 9:05 PM

## 2023-12-04 ENCOUNTER — Encounter (HOSPITAL_COMMUNITY): Payer: Self-pay | Admitting: Obstetrics & Gynecology

## 2023-12-04 ENCOUNTER — Other Ambulatory Visit

## 2023-12-04 LAB — CBC
HCT: 26 % — ABNORMAL LOW (ref 36.0–46.0)
Hemoglobin: 8.8 g/dL — ABNORMAL LOW (ref 12.0–15.0)
MCH: 28.7 pg (ref 26.0–34.0)
MCHC: 33.8 g/dL (ref 30.0–36.0)
MCV: 84.7 fL (ref 80.0–100.0)
Platelets: 305 K/uL (ref 150–400)
RBC: 3.07 MIL/uL — ABNORMAL LOW (ref 3.87–5.11)
RDW: 14.6 % (ref 11.5–15.5)
WBC: 19.7 K/uL — ABNORMAL HIGH (ref 4.0–10.5)
nRBC: 0 % (ref 0.0–0.2)

## 2023-12-04 LAB — BIRTH TISSUE RECOVERY COLLECTION (PLACENTA DONATION)

## 2023-12-04 MED ORDER — ZOLPIDEM TARTRATE 5 MG PO TABS: 5.0000 mg | ORAL_TABLET | Freq: Every evening | ORAL | Status: DC | PRN

## 2023-12-04 MED ORDER — ALBUTEROL SULFATE (2.5 MG/3ML) 0.083% IN NEBU: 2.5000 mg | INHALATION_SOLUTION | Freq: Once | RESPIRATORY_TRACT | Status: DC | PRN

## 2023-12-04 MED ORDER — ONDANSETRON HCL 4 MG PO TABS: 4.0000 mg | ORAL_TABLET | ORAL | Status: DC | PRN

## 2023-12-04 MED ORDER — EPINEPHRINE 0.3 MG/0.3ML IJ SOAJ: 0.3000 mg | Freq: Once | INTRAMUSCULAR | Status: DC | PRN

## 2023-12-04 MED ORDER — METHYLPREDNISOLONE SODIUM SUCC 125 MG IJ SOLR: 125.0000 mg | Freq: Once | INTRAMUSCULAR | Status: DC | PRN

## 2023-12-04 MED ORDER — SODIUM CHLORIDE 0.9 % IV SOLN
500.0000 mg | Freq: Once | INTRAVENOUS | Status: AC
  Administered 2023-12-04: 500 mg via INTRAVENOUS
  Filled 2023-12-04: qty 25

## 2023-12-04 MED ORDER — NIFEDIPINE ER OSMOTIC RELEASE 30 MG PO TB24
30.0000 mg | ORAL_TABLET | Freq: Every day | ORAL | Status: DC
  Administered 2023-12-04 – 2023-12-05 (×2): 30 mg via ORAL
  Filled 2023-12-04 (×2): qty 1

## 2023-12-04 MED ORDER — DIPHENHYDRAMINE HCL 50 MG/ML IJ SOLN: 25.0000 mg | Freq: Once | INTRAMUSCULAR | Status: DC | PRN

## 2023-12-04 MED ORDER — ONDANSETRON HCL 4 MG/2ML IJ SOLN: 4.0000 mg | INTRAMUSCULAR | Status: DC | PRN

## 2023-12-04 MED ORDER — COCONUT OIL OIL: 1.0000 | TOPICAL_OIL | Status: DC | PRN

## 2023-12-04 MED ORDER — IBUPROFEN 600 MG PO TABS
600.0000 mg | ORAL_TABLET | Freq: Four times a day (QID) | ORAL | Status: DC
  Administered 2023-12-04 – 2023-12-05 (×5): 600 mg via ORAL
  Filled 2023-12-04 (×6): qty 1

## 2023-12-04 MED ORDER — MEASLES, MUMPS & RUBELLA VAC IJ SOLR
0.5000 mL | Freq: Once | INTRAMUSCULAR | Status: AC
  Administered 2023-12-05: 0.5 mL via SUBCUTANEOUS
  Filled 2023-12-04: qty 0.5

## 2023-12-04 MED ORDER — ACETAMINOPHEN 325 MG PO TABS
650.0000 mg | ORAL_TABLET | ORAL | Status: DC | PRN
  Administered 2023-12-04: 650 mg via ORAL
  Filled 2023-12-04: qty 2

## 2023-12-04 MED ORDER — SODIUM CHLORIDE 0.9 % IV BOLUS: 500.0000 mL | Freq: Once | INTRAVENOUS | Status: DC | PRN

## 2023-12-04 MED ORDER — SENNOSIDES-DOCUSATE SODIUM 8.6-50 MG PO TABS
2.0000 | ORAL_TABLET | Freq: Every day | ORAL | Status: DC
  Administered 2023-12-04 – 2023-12-05 (×2): 2 via ORAL
  Filled 2023-12-04 (×2): qty 2

## 2023-12-04 MED ORDER — PRENATAL MULTIVITAMIN CH
1.0000 | ORAL_TABLET | Freq: Every day | ORAL | Status: DC
  Administered 2023-12-04: 1 via ORAL
  Filled 2023-12-04: qty 1

## 2023-12-04 MED ORDER — FUROSEMIDE 20 MG PO TABS
20.0000 mg | ORAL_TABLET | Freq: Every day | ORAL | Status: DC
  Administered 2023-12-04 – 2023-12-05 (×2): 20 mg via ORAL
  Filled 2023-12-04 (×2): qty 1

## 2023-12-04 MED ORDER — IRON SUCROSE 500 MG IVPB - SIMPLE MED
500.0000 mg | Freq: Once | INTRAVENOUS | Status: DC
  Filled 2023-12-04: qty 275

## 2023-12-04 MED ORDER — BENZOCAINE-MENTHOL 20-0.5 % EX AERO: 1.0000 | INHALATION_SPRAY | CUTANEOUS | Status: DC | PRN

## 2023-12-04 MED ORDER — SIMETHICONE 80 MG PO CHEW: 80.0000 mg | CHEWABLE_TABLET | ORAL | Status: DC | PRN

## 2023-12-04 MED ORDER — DIPHENHYDRAMINE HCL 25 MG PO CAPS: 25.0000 mg | ORAL_CAPSULE | Freq: Four times a day (QID) | ORAL | Status: DC | PRN

## 2023-12-04 MED ORDER — DIBUCAINE (PERIANAL) 1 % EX OINT: 1.0000 | TOPICAL_OINTMENT | CUTANEOUS | Status: DC | PRN

## 2023-12-04 MED ORDER — WITCH HAZEL-GLYCERIN EX PADS: 1.0000 | MEDICATED_PAD | CUTANEOUS | Status: DC | PRN

## 2023-12-04 MED ORDER — SODIUM CHLORIDE 0.9 % IV SOLN: INTRAVENOUS | Status: AC | PRN

## 2023-12-04 MED ORDER — POTASSIUM CHLORIDE CRYS ER 20 MEQ PO TBCR
20.0000 meq | EXTENDED_RELEASE_TABLET | Freq: Every day | ORAL | Status: DC
  Administered 2023-12-04 – 2023-12-05 (×2): 20 meq via ORAL
  Filled 2023-12-04 (×2): qty 1

## 2023-12-04 NOTE — Lactation Note (Signed)
 This note was copied from a baby's chart. Lactation Consultation Note  Patient Name: Latasha [name] Unijb'd Date: 12/04/2023 Age:34 hours  Mom doesn't want to see Lactation. Denies LC services.  Maternal Data    Feeding    LATCH Score                    Lactation Tools Discussed/Used    Interventions    Discharge    Consult Status Consult Status: Complete    [name] G 12/04/2023, 4:07 AM

## 2023-12-04 NOTE — Progress Notes (Signed)
 Post Partum Day 1 Subjective: no complaints, up ad lib, voiding and tolerating PO, small lochia, plans to breastfeed, plans to bottle feed, plans interval BTL  Objective: Blood pressure 119/80, pulse 96, temperature 98 F (36.7 C), resp. rate 17, height 5' 6 (1.676 m), weight 99.2 kg, SpO2 100%, unknown if currently breastfeeding.  Physical Exam:  General: alert, cooperative and no distress Lochia:normal flow Chest: CTAB Heart: RRR no m/r/g Abdomen: +BS, soft, nontender,  Uterine Fundus: firm DVT Evaluation: No evidence of DVT seen on physical exam. Extremities: trace edema  Recent Labs    12/03/23 2256 12/04/23 0519  HGB 9.8* 8.8*  HCT 29.2* 26.0*    Assessment/Plan: Plan for discharge tomorrow, Breastfeeding, Lactation consult, and Circumcision prior to discharge   LOS: 1 day   Latasha [name] 12/04/2023, 5:58 AM

## 2023-12-05 MED ORDER — POTASSIUM CHLORIDE CRYS ER 20 MEQ PO TBCR: 20.0000 meq | EXTENDED_RELEASE_TABLET | Freq: Every day | ORAL | 0 refills | Status: DC

## 2023-12-05 MED ORDER — ACETAMINOPHEN 500 MG PO TABS: 500.0000 mg | ORAL_TABLET | ORAL | Status: AC | PRN

## 2023-12-05 MED ORDER — NIFEDIPINE ER 30 MG PO TB24: 30.0000 mg | ORAL_TABLET | Freq: Every day | ORAL | 3 refills | Status: AC

## 2023-12-05 MED ORDER — IBUPROFEN 200 MG PO TABS: 600.0000 mg | ORAL_TABLET | Freq: Four times a day (QID) | ORAL | Status: AC

## 2023-12-05 MED ORDER — MEASLES, MUMPS & RUBELLA VAC IJ SOLR: 0.5000 mL | Freq: Once | INTRAMUSCULAR | 0 refills | Status: AC

## 2023-12-05 MED ORDER — FUROSEMIDE 20 MG PO TABS: 20.0000 mg | ORAL_TABLET | Freq: Every day | ORAL | 0 refills | Status: DC

## 2023-12-05 NOTE — Plan of Care (Signed)
  Problem: Education: Goal: Knowledge of General Education information will improve Description: Including pain rating scale, medication(s)/side effects and non-pharmacologic comfort measures Outcome: Completed/Met   Problem: Health Behavior/Discharge Planning: Goal: Ability to manage health-related needs will improve Outcome: Completed/Met   Problem: Clinical Measurements: Goal: Ability to maintain clinical measurements within normal limits will improve Outcome: Completed/Met Goal: Will remain free from infection Outcome: Completed/Met Goal: Diagnostic test results will improve Outcome: Completed/Met Goal: Respiratory complications will improve Outcome: Completed/Met Goal: Cardiovascular complication will be avoided Outcome: Completed/Met   Problem: Activity: Goal: Risk for activity intolerance will decrease Outcome: Completed/Met   Problem: Nutrition: Goal: Adequate nutrition will be maintained Outcome: Completed/Met   Problem: Coping: Goal: Level of anxiety will decrease Outcome: Completed/Met   Problem: Elimination: Goal: Will not experience complications related to bowel motility Outcome: Completed/Met Goal: Will not experience complications related to urinary retention Outcome: Completed/Met   Problem: Pain Managment: Goal: General experience of comfort will improve and/or be controlled Outcome: Completed/Met   Problem: Safety: Goal: Ability to remain free from injury will improve Outcome: Completed/Met   Problem: Skin Integrity: Goal: Risk for impaired skin integrity will decrease Outcome: Completed/Met   Problem: Education: Goal: Knowledge of Childbirth will improve Outcome: Completed/Met Goal: Ability to make informed decisions regarding treatment and plan of care will improve Outcome: Completed/Met Goal: Ability to state and carry out methods to decrease the pain will improve Outcome: Completed/Met Goal: Individualized Educational Video(s) Outcome:  Completed/Met   Problem: Coping: Goal: Ability to verbalize concerns and feelings about labor and delivery will improve Outcome: Completed/Met   Problem: Life Cycle: Goal: Ability to make normal progression through stages of labor will improve Outcome: Completed/Met Goal: Ability to effectively push during vaginal delivery will improve Outcome: Completed/Met   Problem: Role Relationship: Goal: Will demonstrate positive interactions with the child Outcome: Completed/Met   Problem: Safety: Goal: Risk of complications during labor and delivery will decrease Outcome: Completed/Met   Problem: Pain Management: Goal: Relief or control of pain from uterine contractions will improve Outcome: Completed/Met   Problem: Education: Goal: Knowledge of disease or condition will improve Outcome: Completed/Met Goal: Knowledge of the prescribed therapeutic regimen will improve Outcome: Completed/Met   Problem: Fluid Volume: Goal: Peripheral tissue perfusion will improve Outcome: Completed/Met   Problem: Clinical Measurements: Goal: Complications related to disease process, condition or treatment will be avoided or minimized Outcome: Completed/Met   Problem: Education: Goal: Knowledge of condition will improve Outcome: Completed/Met Goal: Individualized Educational Video(s) Outcome: Completed/Met Goal: Individualized Newborn Educational Video(s) Outcome: Completed/Met   Problem: Activity: Goal: Will verbalize the importance of balancing activity with adequate rest periods Outcome: Completed/Met Goal: Ability to tolerate increased activity will improve Outcome: Completed/Met   Problem: Coping: Goal: Ability to identify and utilize available resources and services will improve Outcome: Completed/Met   Problem: Life Cycle: Goal: Chance of risk for complications during the postpartum period will decrease Outcome: Completed/Met   Problem: Role Relationship: Goal: Ability to  demonstrate positive interaction with newborn will improve Outcome: Completed/Met   Problem: Skin Integrity: Goal: Demonstration of wound healing without infection will improve Outcome: Completed/Met

## 2023-12-08 ENCOUNTER — Other Ambulatory Visit

## 2023-12-10 ENCOUNTER — Encounter

## 2023-12-11 ENCOUNTER — Encounter: Admitting: Obstetrics & Gynecology

## 2023-12-11 ENCOUNTER — Other Ambulatory Visit

## 2023-12-11 ENCOUNTER — Encounter: Admitting: Obstetrics and Gynecology

## 2023-12-15 ENCOUNTER — Other Ambulatory Visit

## 2023-12-15 ENCOUNTER — Encounter: Admitting: Obstetrics & Gynecology

## 2023-12-18 ENCOUNTER — Telehealth (HOSPITAL_COMMUNITY): Payer: Self-pay | Admitting: *Deleted

## 2023-12-18 ENCOUNTER — Other Ambulatory Visit

## 2023-12-18 NOTE — Telephone Encounter (Signed)
 12/18/2023  Name: [name] MRN: [id number] DOB: 1990-12-07  Reason for Call:  Transition of Care Hospital Discharge Call  Contact Status: Patient Contact Status: Message  Language assistant needed: Interpreter Mode: Interpreter Not Needed        Follow-Up Questions:    Van Postnatal Depression Scale:  In the Past 7 Days:    PHQ2-9 Depression Scale:     Discharge Follow-up:    Post-discharge interventions: NA  Mliss Sieve, RN 12/18/2023 11:42

## 2023-12-22 ENCOUNTER — Other Ambulatory Visit

## 2023-12-22 ENCOUNTER — Encounter: Admitting: Obstetrics & Gynecology

## 2024-01-07 ENCOUNTER — Encounter: Payer: Self-pay | Admitting: Obstetrics & Gynecology

## 2024-01-07 ENCOUNTER — Ambulatory Visit: Admitting: Obstetrics & Gynecology

## 2024-01-07 DIAGNOSIS — Z1331 Encounter for screening for depression: Secondary | ICD-10-CM

## 2024-01-07 DIAGNOSIS — I10 Essential (primary) hypertension: Secondary | ICD-10-CM

## 2024-01-07 DIAGNOSIS — Z30011 Encounter for initial prescription of contraceptive pills: Secondary | ICD-10-CM | POA: Diagnosis not present

## 2024-01-07 MED ORDER — SLYND 4 MG PO TABS: 1.0000 | ORAL_TABLET | Freq: Every day | ORAL | 4 refills | Status: DC

## 2024-01-07 NOTE — Progress Notes (Signed)
 POSTPARTUM VISIT Patient name: Latasha [name] MRN [id number]  Date of birth: Aug 30, 1990 Chief Complaint:   Postpartum Care  History of Present Illness:   Latasha [name] is a 33 y.o. G56P2012 female being seen today for a postpartum visit. She is 5 weeks postpartum following a spontaneous vaginal delivery at 37 gestational weeks. IOL: Yes, for chronic HTN.   -ProcardiaXL 30mg  daily, did not take medication today.  Normally at home BP 120/70s  Pregnancy complicated by cHTN, obesity.  Last pap smear: 09/2021    Postpartum course has been uncomplicated.  Bleeding no bleeding. Bowel function is normal. Bladder function is normal. Urinary incontinence? No, fecal incontinence? No Patient is not sexually active. Last sexual activity: Prior to delivery.   Desired contraception: POPs. Patient does not know want a pregnancy in the future, but may consider another child  Desired family size is 3 children.   Upstream - 01/07/24 1053       Pregnancy Intention Screening   Does the patient want to become pregnant in the next year? No    Does the patient's partner want to become pregnant in the next year? No    Would the patient like to discuss contraceptive options today? Yes      Contraception Wrap Up   Current Method Abstinence    End Method Hormonal Implant         The pregnancy intention screening data noted above was reviewed. Potential methods of contraception were discussed. The patient elected to proceed with Hormonal Implant.  Edinburgh Postpartum Depression Screening: Negative  Edinburgh Postnatal Depression Scale - 01/07/24 1048       Edinburgh Postnatal Depression Scale:  In the Past 7 Days   I have been able to laugh and see the funny side of things. 0    I have looked forward with enjoyment to things. 0    I have blamed myself unnecessarily when things went wrong. 1    I have been anxious or worried for no good reason. 1    I have felt scared or panicky for no good  reason. 0    Things have been getting on top of me. 1    I have been so unhappy that I have had difficulty sleeping. 0    I have felt sad or miserable. 0    I have been so unhappy that I have been crying. 0    The thought of harming myself has occurred to me. 0    Edinburgh Postnatal Depression Scale Total 3          Baby's course has been uncomplicated. Baby is feeding by bottle. Infant has a pediatrician/family doctor? Yes.  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  Pt has material needs met for her and baby: Yes.    Review of Systems:   Pertinent items are noted in HPI Denies Abnormal vaginal discharge w/ itching/odor/irritation, headaches, visual changes, shortness of breath, chest pain, abdominal pain, severe nausea/vomiting, or problems with urination or bowel movements. Pertinent History Reviewed:  Reviewed past medical,surgical, obstetrical and family history.  Reviewed problem list, medications and allergies. OB History  Gravida Para Term Preterm AB Living  3 2 2  1 2   SAB IAB Ectopic Multiple Live Births  1   0 2    # Outcome Date GA Lbr Len/2nd Weight Sex Type Anes PTL Lv  3 Term 12/03/23 [redacted]w[redacted]d 02:26 / 00:05 6 lb 3 oz (2.807 kg) M Vag-Spont  None  LIV  2 Term 02/25/22 [redacted]w[redacted]d 04:16 / 00:06 6 lb 10.9 oz (3.03 kg) M Vag-Spont EPI  LIV     Complications: Gestational hypertension  1 SAB            Physical Assessment:   Vitals:   01/07/24 1045 01/07/24 1049  BP: (!) 179/102 (!) 187/102  Pulse:  78  Weight:  221 lb (100.2 kg)  Height:  5' 6 (1.676 m)  Body mass index is 35.67 kg/m.       Physical Examination:   General appearance: alert, well appearing, and in no distress  Mental status: normal mood, behavior, speech, dress, motor activity, and thought processes  Skin: warm & dry   Cardiovascular: RRR  Respiratory: CTAB  Breasts: no masses or abnormalities noted   Abdomen: soft, non-tender   Pelvic: normal external genitalia, vulva,  vagina, cervix, uterus and adnexa  Extremities: No edema  Chaperone: Aleck Blase          Assessment & Plan:  1) Postpartum exam 2) 5 wks s/p spontaneous vaginal delivery 3) bottle feeding 4) Depression screening 5) Contraception management: due to cHTN- plan for POPs 6) Chronic HTN -pt to return for BP check, and take medication prior to appointment -ok to stop ASA daily  Essential components of care per ACOG recommendations:  1.  Mood and well being:  If positive depression screen, discussed and plan developed.  If using tobacco we discussed reduction/cessation and risk of relapse If current substance abuse, we discussed and referral to local resources was offered.   2. Sexuality, contraception and birth spacing Provided guidance regarding sexuality, management of dyspareunia, and resumption of intercourse Discussed avoiding interpregnancy interval <13mths and recommended birth spacing of 18 months  3. Physical recovery  Encouraged pelvic floor exercises Patient is safe to resume physical activity. Discussed attainment of healthy weight.  4. Health maintenance Mammogram at 34yo or earlier if indicated Pap smears up to date, completed 2024  Meds:  Meds ordered this encounter  Medications   Drospirenone  (SLYND ) 4 MG TABS    Sig: Take 1 tablet (4 mg total) by mouth daily.    Dispense:  90 tablet    Refill:  4    Follow-up: Return for 1wk RN visit BP check, then 56yr annual.   No orders of the defined types were placed in this encounter.   Ryenn Howeth, DO Attending Obstetrician & Gynecologist, [facility] for [employer], [facility]

## 2024-01-08 ENCOUNTER — Other Ambulatory Visit: Payer: Self-pay | Admitting: *Deleted

## 2024-01-08 DIAGNOSIS — Z30011 Encounter for initial prescription of contraceptive pills: Secondary | ICD-10-CM

## 2024-01-08 MED ORDER — SLYND 4 MG PO TABS: 1.0000 | ORAL_TABLET | Freq: Every day | ORAL | 4 refills | Status: AC

## 2024-01-15 ENCOUNTER — Encounter: Payer: Self-pay | Admitting: *Deleted

## 2024-01-15 ENCOUNTER — Telehealth (INDEPENDENT_AMBULATORY_CARE_PROVIDER_SITE_OTHER): Admitting: *Deleted

## 2024-01-15 VITALS — BP 127/76

## 2024-01-15 DIAGNOSIS — Z0130 Encounter for examination of blood pressure without abnormal findings: Secondary | ICD-10-CM

## 2024-01-15 DIAGNOSIS — I10 Essential (primary) hypertension: Secondary | ICD-10-CM

## 2024-01-15 NOTE — Progress Notes (Signed)
   NURSE VISIT- BLOOD PRESSURE CHECK  I connected with Latasha [name] on 01/15/2024 by telephone  and verified that I am speaking with the correct person using two identifiers.   I discussed the limitations of evaluation and management by telemedicine. The patient expressed understanding and agreed to proceed.  Nurse is at the office, and patient is at home.  SUBJECTIVE:  Latasha [name] is a 33 y.o. G77P2012 female here for BP check. She is postpartum, delivery date 12/03/23    HYPERTENSION ROS:  Postpartum:  Severe headaches that don't go away with tylenol /other medicines: No , notes mild headache but thinks it could be allergy or sinus related Visual changes (seeing spots/double/blurred vision) No  Severe pain under right breast breast or in center of upper chest No  Severe nausea/vomiting No  Taking medicines as instructed yes   OBJECTIVE:  BP 127/76 (BP Location: Left Arm, Patient Position: Sitting, Cuff Size: Normal)   LMP  (LMP Unknown)   Breastfeeding Yes   Appearance alert, well appearing, and in no distress.  ASSESSMENT: Postpartum  blood pressure check  PLAN: Discussed with Dr. Ozan   Recommendations: no changes needed.Pt to take medication to treat allergies/sinus and if no improvement, then to let us  know and will switch her back to HCTZ  Follow-up: as needed   Latasha [name]  01/15/2024 12:22 PM
